# Patient Record
Sex: Male | Born: 1965 | Race: White | Hispanic: No | Marital: Single | Smoking: Never smoker
Health system: Southern US, Community
[De-identification: ages and names within clinical notes are randomized; demographics above are authoritative.]

## PROBLEM LIST (undated history)

## (undated) DIAGNOSIS — I10 Essential (primary) hypertension: Secondary | ICD-10-CM

## (undated) DIAGNOSIS — E119 Type 2 diabetes mellitus without complications: Secondary | ICD-10-CM

## (undated) HISTORY — PX: VASECTOMY: SHX75

## (undated) HISTORY — PX: CHOLECYSTECTOMY: SHX55

---

## 2019-12-21 ENCOUNTER — Encounter (INDEPENDENT_AMBULATORY_CARE_PROVIDER_SITE_OTHER): Payer: Self-pay

## 2020-01-17 ENCOUNTER — Encounter: Payer: Self-pay | Admitting: *Deleted

## 2020-01-17 ENCOUNTER — Other Ambulatory Visit: Payer: Self-pay

## 2020-01-17 ENCOUNTER — Emergency Department (HOSPITAL_COMMUNITY): Payer: 59

## 2020-01-17 ENCOUNTER — Inpatient Hospital Stay (HOSPITAL_COMMUNITY)
Admission: EM | Admit: 2020-01-17 | Discharge: 2020-01-26 | DRG: 872 | Disposition: A | Discharge status: Skilled Nursing Facility | Payer: 59 | Attending: Internal Medicine | Admitting: Internal Medicine

## 2020-01-17 ENCOUNTER — Encounter (HOSPITAL_COMMUNITY): Payer: Self-pay

## 2020-01-17 ENCOUNTER — Telehealth: Payer: Self-pay | Admitting: *Deleted

## 2020-01-17 DIAGNOSIS — Z23 Encounter for immunization: Secondary | ICD-10-CM | POA: Diagnosis not present

## 2020-01-17 DIAGNOSIS — I890 Lymphedema, not elsewhere classified: Secondary | ICD-10-CM | POA: Diagnosis present

## 2020-01-17 DIAGNOSIS — K7030 Alcoholic cirrhosis of liver without ascites: Secondary | ICD-10-CM | POA: Diagnosis not present

## 2020-01-17 DIAGNOSIS — N433 Hydrocele, unspecified: Secondary | ICD-10-CM | POA: Diagnosis present

## 2020-01-17 DIAGNOSIS — Z833 Family history of diabetes mellitus: Secondary | ICD-10-CM

## 2020-01-17 DIAGNOSIS — R17 Unspecified jaundice: Secondary | ICD-10-CM | POA: Diagnosis present

## 2020-01-17 DIAGNOSIS — Z8249 Family history of ischemic heart disease and other diseases of the circulatory system: Secondary | ICD-10-CM | POA: Diagnosis not present

## 2020-01-17 DIAGNOSIS — L03115 Cellulitis of right lower limb: Secondary | ICD-10-CM | POA: Diagnosis present

## 2020-01-17 DIAGNOSIS — D696 Thrombocytopenia, unspecified: Secondary | ICD-10-CM | POA: Diagnosis present

## 2020-01-17 DIAGNOSIS — I10 Essential (primary) hypertension: Secondary | ICD-10-CM | POA: Diagnosis present

## 2020-01-17 DIAGNOSIS — Z885 Allergy status to narcotic agent status: Secondary | ICD-10-CM

## 2020-01-17 DIAGNOSIS — N5089 Other specified disorders of the male genital organs: Secondary | ICD-10-CM | POA: Diagnosis not present

## 2020-01-17 DIAGNOSIS — D649 Anemia, unspecified: Secondary | ICD-10-CM | POA: Diagnosis present

## 2020-01-17 DIAGNOSIS — I358 Other nonrheumatic aortic valve disorders: Secondary | ICD-10-CM | POA: Diagnosis present

## 2020-01-17 DIAGNOSIS — A419 Sepsis, unspecified organism: Secondary | ICD-10-CM | POA: Diagnosis present

## 2020-01-17 DIAGNOSIS — N492 Inflammatory disorders of scrotum: Secondary | ICD-10-CM | POA: Diagnosis present

## 2020-01-17 DIAGNOSIS — E871 Hypo-osmolality and hyponatremia: Secondary | ICD-10-CM | POA: Diagnosis present

## 2020-01-17 DIAGNOSIS — R6520 Severe sepsis without septic shock: Secondary | ICD-10-CM | POA: Diagnosis not present

## 2020-01-17 DIAGNOSIS — E1165 Type 2 diabetes mellitus with hyperglycemia: Secondary | ICD-10-CM | POA: Diagnosis present

## 2020-01-17 DIAGNOSIS — E860 Dehydration: Secondary | ICD-10-CM | POA: Diagnosis present

## 2020-01-17 DIAGNOSIS — K7460 Unspecified cirrhosis of liver: Secondary | ICD-10-CM | POA: Diagnosis present

## 2020-01-17 DIAGNOSIS — R601 Generalized edema: Secondary | ICD-10-CM | POA: Diagnosis present

## 2020-01-17 DIAGNOSIS — E8809 Other disorders of plasma-protein metabolism, not elsewhere classified: Secondary | ICD-10-CM | POA: Diagnosis present

## 2020-01-17 DIAGNOSIS — D638 Anemia in other chronic diseases classified elsewhere: Secondary | ICD-10-CM | POA: Diagnosis present

## 2020-01-17 DIAGNOSIS — R609 Edema, unspecified: Secondary | ICD-10-CM

## 2020-01-17 DIAGNOSIS — Z590 Homelessness: Secondary | ICD-10-CM | POA: Diagnosis not present

## 2020-01-17 DIAGNOSIS — Z20822 Contact with and (suspected) exposure to covid-19: Secondary | ICD-10-CM | POA: Diagnosis present

## 2020-01-17 HISTORY — DX: Essential (primary) hypertension: I10

## 2020-01-17 HISTORY — DX: Type 2 diabetes mellitus without complications: E11.9

## 2020-01-17 LAB — APTT: aPTT: 37 seconds — ABNORMAL HIGH (ref 24–36)

## 2020-01-17 LAB — CBG MONITORING, ED
Glucose-Capillary: 360 mg/dL — ABNORMAL HIGH (ref 70–99)
Glucose-Capillary: 336 mg/dL — ABNORMAL HIGH (ref 70–99)
Glucose-Capillary: 282 mg/dL — ABNORMAL HIGH (ref 70–99)

## 2020-01-17 LAB — CBC WITH DIFFERENTIAL/PLATELET
Abs Immature Granulocytes: 0.05 10*3/uL (ref 0.00–0.07)
Basophils Absolute: 0 10*3/uL (ref 0.0–0.1)
Basophils Relative: 0 %
Eosinophils Absolute: 0 10*3/uL (ref 0.0–0.5)
Eosinophils Relative: 0 %
HCT: 33.3 % — ABNORMAL LOW (ref 39.0–52.0)
Hemoglobin: 11.1 g/dL — ABNORMAL LOW (ref 13.0–17.0)
Immature Granulocytes: 1 %
Lymphocytes Relative: 7 %
Lymphs Abs: 0.5 10*3/uL — ABNORMAL LOW (ref 0.7–4.0)
MCH: 30.2 pg (ref 26.0–34.0)
MCHC: 33.3 g/dL (ref 30.0–36.0)
MCV: 90.7 fL (ref 80.0–100.0)
Monocytes Absolute: 0.7 10*3/uL (ref 0.1–1.0)
Monocytes Relative: 9 %
Neutro Abs: 6.5 10*3/uL (ref 1.7–7.7)
Neutrophils Relative %: 83 %
Platelets: UNDETERMINED 10*3/uL (ref 150–400)
RBC: 3.67 MIL/uL — ABNORMAL LOW (ref 4.22–5.81)
RDW: 13.2 % (ref 11.5–15.5)
WBC: 7.7 10*3/uL (ref 4.0–10.5)
nRBC: 0 % (ref 0.0–0.2)

## 2020-01-17 LAB — COMPREHENSIVE METABOLIC PANEL
ALT: 14 U/L (ref 0–44)
AST: 24 U/L (ref 15–41)
Albumin: 2 g/dL — ABNORMAL LOW (ref 3.5–5.0)
Alkaline Phosphatase: 99 U/L (ref 38–126)
Anion gap: 14 (ref 5–15)
BUN: 20 mg/dL (ref 6–20)
CO2: 16 mmol/L — ABNORMAL LOW (ref 22–32)
Calcium: 7.4 mg/dL — ABNORMAL LOW (ref 8.9–10.3)
Chloride: 93 mmol/L — ABNORMAL LOW (ref 98–111)
Creatinine, Ser: 1.04 mg/dL (ref 0.61–1.24)
GFR calc Af Amer: >60 mL/min (ref >60)
GFR calc non Af Amer: >60 mL/min (ref >60)
Glucose, Bld: 377 mg/dL — ABNORMAL HIGH (ref 70–99)
Potassium: 4.2 mmol/L (ref 3.5–5.1)
Sodium: 123 mmol/L — ABNORMAL LOW (ref 135–145)
Total Bilirubin: 3.4 mg/dL — ABNORMAL HIGH (ref 0.3–1.2)
Total Protein: 5.6 g/dL — ABNORMAL LOW (ref 6.5–8.1)

## 2020-01-17 LAB — URINALYSIS, ROUTINE W REFLEX MICROSCOPIC
Bilirubin Urine: NEGATIVE
Glucose, UA: >=500 mg/dL — AB
Ketones, ur: 5 mg/dL — AB
Leukocytes,Ua: NEGATIVE
Nitrite: NEGATIVE
Protein, ur: 30 mg/dL — AB
Specific Gravity, Urine: 1.031 — ABNORMAL HIGH (ref 1.005–1.030)
pH: 5 (ref 5.0–8.0)

## 2020-01-17 LAB — LACTIC ACID, PLASMA
Lactic Acid, Venous: 3.6 mmol/L (ref 0.5–1.9)
Lactic Acid, Venous: 2.5 mmol/L (ref 0.5–1.9)

## 2020-01-17 LAB — CBC
HCT: 32.8 % — ABNORMAL LOW (ref 39.0–52.0)
Hemoglobin: 11 g/dL — ABNORMAL LOW (ref 13.0–17.0)
MCH: 30.6 pg (ref 26.0–34.0)
MCHC: 33.5 g/dL (ref 30.0–36.0)
MCV: 91.1 fL (ref 80.0–100.0)
Platelets: UNDETERMINED 10*3/uL (ref 150–400)
RBC: 3.6 MIL/uL — ABNORMAL LOW (ref 4.22–5.81)
RDW: 13 % (ref 11.5–15.5)
WBC: 6.5 10*3/uL (ref 4.0–10.5)
nRBC: 0 % (ref 0.0–0.2)

## 2020-01-17 LAB — HEMOGLOBIN A1C
Hgb A1c MFr Bld: 8.8 % — ABNORMAL HIGH (ref 4.8–5.6)
Mean Plasma Glucose: 205.86 mg/dL

## 2020-01-17 LAB — PROTIME-INR
INR: 1.5 — ABNORMAL HIGH (ref 0.8–1.2)
Prothrombin Time: 17.7 seconds — ABNORMAL HIGH (ref 11.4–15.2)

## 2020-01-17 LAB — HIV ANTIBODY (ROUTINE TESTING W REFLEX): HIV Screen 4th Generation wRfx: NONREACTIVE

## 2020-01-17 LAB — CREATININE, SERUM
Creatinine, Ser: 1.02 mg/dL (ref 0.61–1.24)
GFR calc Af Amer: >60 mL/min (ref >60)
GFR calc non Af Amer: >60 mL/min (ref >60)

## 2020-01-17 LAB — SARS CORONAVIRUS 2 BY RT PCR (HOSPITAL ORDER, PERFORMED IN ~~LOC~~ HOSPITAL LAB): SARS Coronavirus 2: NEGATIVE

## 2020-01-17 LAB — PROCALCITONIN: Procalcitonin: 0.77 ng/mL

## 2020-01-17 MED ORDER — VANCOMYCIN HCL 2000 MG/400ML IV SOLN
2000.0000 mg | Freq: Once | INTRAVENOUS | Status: AC
  Administered 2020-01-17: 2000 mg via INTRAVENOUS
  Filled 2020-01-17: qty 400

## 2020-01-17 MED ORDER — LACTATED RINGERS IV SOLN: INTRAVENOUS | Status: AC

## 2020-01-17 MED ORDER — HEPARIN SODIUM (PORCINE) 5000 UNIT/ML IJ SOLN
5000.0000 [IU] | Freq: Three times a day (TID) | INTRAMUSCULAR | Status: DC
  Administered 2020-01-17 – 2020-01-18 (×3): 5000 [IU] via SUBCUTANEOUS
  Filled 2020-01-17 (×3): qty 1

## 2020-01-17 MED ORDER — METRONIDAZOLE IN NACL 5-0.79 MG/ML-% IV SOLN
500.0000 mg | Freq: Once | INTRAVENOUS | Status: AC
  Administered 2020-01-17: 500 mg via INTRAVENOUS
  Filled 2020-01-17: qty 100

## 2020-01-17 MED ORDER — LACTATED RINGERS IV BOLUS (SEPSIS)
1000.0000 mL | Freq: Once | INTRAVENOUS | Status: AC
  Administered 2020-01-17: 1000 mL via INTRAVENOUS

## 2020-01-17 MED ORDER — ONDANSETRON HCL 4 MG PO TABS: 4.0000 mg | ORAL_TABLET | Freq: Four times a day (QID) | ORAL | Status: DC | PRN

## 2020-01-17 MED ORDER — VANCOMYCIN HCL IN DEXTROSE 1-5 GM/200ML-% IV SOLN: 1000.0000 mg | Freq: Once | INTRAVENOUS | Status: DC

## 2020-01-17 MED ORDER — VANCOMYCIN HCL 1250 MG/250ML IV SOLN
1250.0000 mg | Freq: Three times a day (TID) | INTRAVENOUS | Status: DC
  Administered 2020-01-18 (×2): 1250 mg via INTRAVENOUS
  Filled 2020-01-17 (×2): qty 250

## 2020-01-17 MED ORDER — ONDANSETRON HCL 4 MG/2ML IJ SOLN
4.0000 mg | Freq: Four times a day (QID) | INTRAMUSCULAR | Status: DC | PRN
  Administered 2020-01-19: 4 mg via INTRAVENOUS
  Filled 2020-01-17: qty 2

## 2020-01-17 MED ORDER — INSULIN ASPART 100 UNIT/ML ~~LOC~~ SOLN
0.0000 [IU] | Freq: Three times a day (TID) | SUBCUTANEOUS | Status: DC
  Administered 2020-01-18: 3 [IU] via SUBCUTANEOUS

## 2020-01-17 MED ORDER — SODIUM CHLORIDE 0.9 % IV SOLN
1.0000 g | Freq: Three times a day (TID) | INTRAVENOUS | Status: DC
  Administered 2020-01-18 (×2): 1 g via INTRAVENOUS
  Filled 2020-01-17 (×3): qty 1

## 2020-01-17 MED ORDER — INSULIN ASPART 100 UNIT/ML ~~LOC~~ SOLN
5.0000 [IU] | Freq: Three times a day (TID) | SUBCUTANEOUS | Status: DC
  Administered 2020-01-18 – 2020-01-26 (×26): 5 [IU] via SUBCUTANEOUS

## 2020-01-17 MED ORDER — ACETAMINOPHEN 650 MG RE SUPP: 650.0000 mg | Freq: Four times a day (QID) | RECTAL | Status: DC | PRN

## 2020-01-17 MED ORDER — HYDRALAZINE HCL 20 MG/ML IJ SOLN: 10.0000 mg | INTRAMUSCULAR | Status: DC | PRN

## 2020-01-17 MED ORDER — IOHEXOL 300 MG/ML  SOLN
100.0000 mL | Freq: Once | INTRAMUSCULAR | Status: AC | PRN
  Administered 2020-01-17: 100 mL via INTRAVENOUS

## 2020-01-17 MED ORDER — INSULIN GLARGINE 100 UNIT/ML ~~LOC~~ SOLN
25.0000 [IU] | Freq: Every day | SUBCUTANEOUS | Status: DC
  Administered 2020-01-17 – 2020-01-25 (×9): 25 [IU] via SUBCUTANEOUS
  Filled 2020-01-17 (×11): qty 0.25

## 2020-01-17 MED ORDER — ACETAMINOPHEN 325 MG PO TABS
650.0000 mg | ORAL_TABLET | Freq: Four times a day (QID) | ORAL | Status: DC | PRN
  Administered 2020-01-18 – 2020-01-25 (×10): 650 mg via ORAL
  Filled 2020-01-17 (×10): qty 2

## 2020-01-17 MED ORDER — SODIUM CHLORIDE 0.9 % IV SOLN
2.0000 g | Freq: Once | INTRAVENOUS | Status: AC
  Administered 2020-01-17: 2 g via INTRAVENOUS
  Filled 2020-01-17: qty 2

## 2020-01-17 NOTE — Sepsis Progress Note (Signed)
Notified provider of need to draw repeat lactic acid. Lactic acid ordered.

## 2020-01-17 NOTE — ED Notes (Signed)
RN assumed care for pt, pt denies any needs at this time, pt a/ox4 VSS, pt requesting food, RN will review orders to see if pt can eat.

## 2020-01-17 NOTE — Sepsis Progress Note (Signed)
Notified bedside nurse of need to draw repeat lactic acid. 

## 2020-01-17 NOTE — ED Notes (Signed)
Date and time results received: 01/17/20 1617 (use smartphrase ".now" to insert current time)  Test: lactic Critical Value: 3.6  Name of Provider Notified: allen  Orders Received? Or Actions Taken?:orderd are in

## 2020-01-17 NOTE — ED Provider Notes (Signed)
[facility]   CSN: [id number] Arrival date & time: 01/17/20  1404     History Chief Complaint  Patient presents with  . multiple complaints    Swiollen legs, pain in calf musles, diarhea, difficulty breathing , high bp, high blood sugar, pt has not taken meds (metformin)    Dennis CohoJames Green is a 54 y.o. male with history of diabetes, hypertension presents for evaluation of acute onset, progressively worsening right lower extremity swelling and skin color changes for 2 weeks, nausea vomiting diarrhea and fevers for 3 days.   Reports that 2 weeks ago he noted an area of erythema to the posterior aspect of the right thigh.  Reports the area has remained similar in size.  No known injuries or insect bites.  The extremity has become progressively more swollen which is atypical for him per his report.  Denies numbness or tingling to the area.  On Friday developed nausea, vomiting, and diarrhea.  Emesis is nonbloody nonbilious and diarrhea is watery but nonbloody.  Has had subjective fevers and chills.  [facility] where he was noted to be febrile to 103.1 F, hyperglycemic with a sugar of 413, and hypertensive with a blood pressure of 183/78.  Reports he has not been able to keep down any of his medications for the last few days.  He ambulates with the aid of a cane at baseline due to a prior back injury years ago.  He is a non-smoker, denies recreational drug use or excessive alcohol intake.  Denies recent travel or surgeries, hemoptysis, prior history of DVT or PE, or suspicious food intake.  He is fully vaccinated against Covid.  The history is provided by the patient and medical records.       Past Medical History:  Diagnosis Date  . Diabetes mellitus without complication (HCC)   . Hypertension     Patient Active Problem List   Diagnosis Date Noted  . Sepsis (HCC) 01/17/2020        Family History  Problem Relation  Age of Onset  . Hypertension Mother   . Hypertension Father   . Diabetes Father   . Cancer Sister     Social History   Tobacco Use  . Smoking status: Never Smoker  . Smokeless tobacco: Never Used  Vaping Use  . Vaping Use: Never used  Substance Use Topics  . Alcohol use: Yes    Comment: 2 drinks a year   . Drug use: Never    Home Medications Prior to Admission medications   Not on File    Allergies    Codeine  Review of Systems   Review of Systems  Constitutional: Positive for chills and fever.  Respiratory: Negative for shortness of breath.   Cardiovascular: Positive for leg swelling. Negative for chest pain.  Gastrointestinal: Positive for diarrhea, nausea and vomiting.  Skin: Positive for color change.  Neurological: Negative for numbness.  All other systems reviewed and are negative.   Physical Exam Updated Vital Signs BP (!) 117/57   Pulse 77   Temp (!) 100.7 F (38.2 C) (Oral)   Resp (!) 36   Ht 5\' 9"  (1.753 m)   Wt 132 kg   SpO2 100%   BMI 42.97 kg/m   Physical Exam Vitals and nursing note reviewed.  Constitutional:      General: He is not in acute distress.    Appearance: He is well-developed.  HENT:  Head: Normocephalic and atraumatic.  Eyes:     General:        Right eye: No discharge.        Left eye: No discharge.     Conjunctiva/sclera: Conjunctivae normal.  Neck:     Vascular: No JVD.     Trachea: No tracheal deviation.  Cardiovascular:     Rate and Rhythm: Normal rate and regular rhythm.     Pulses: Normal pulses.     Comments: 2+ radial and DP/PT pulses bilaterally.  Denna Haggard' sign absent bilaterally.  2+ pitting edema of the right lower extremity Pulmonary:     Effort: Pulmonary effort is normal.  Abdominal:     General: There is no distension.     Palpations: Abdomen is soft.     Tenderness: There is no abdominal tenderness. There is no guarding or rebound.  Musculoskeletal:     Cervical back: Neck supple.     Comments:  5/5 strength of BLE major muscle groups  Skin:    General: Skin is warm.     Findings: Erythema and rash present.     Comments: See below images.  Patient with significant erythema and induration to the posterior right thigh extending to the calf although the posterior aspect of the rash appears more petechial.  It does not blanch.  No underlying fluctuance noted.  Neurological:     Mental Status: He is alert.     Comments: Sensation intact to light touch of bilateral lower extremities  Psychiatric:        Behavior: Behavior normal.         ED Results / Procedures / Treatments   Labs (all labs ordered are listed, but only abnormal results are displayed) Labs Reviewed  LACTIC ACID, PLASMA - Abnormal; Notable for the following components:      Result Value   Lactic Acid, Venous 3.6 (*)    All other components within normal limits  COMPREHENSIVE METABOLIC PANEL - Abnormal; Notable for the following components:   Sodium 123 (*)    Chloride 93 (*)    CO2 16 (*)    Glucose, Bld 377 (*)    Calcium 7.4 (*)    Total Protein 5.6 (*)    Albumin 2.0 (*)    Total Bilirubin 3.4 (*)    All other components within normal limits  CBC WITH DIFFERENTIAL/PLATELET - Abnormal; Notable for the following components:   RBC 3.67 (*)    Hemoglobin 11.1 (*)    HCT 33.3 (*)    Lymphs Abs 0.5 (*)    All other components within normal limits  PROTIME-INR - Abnormal; Notable for the following components:   Prothrombin Time 17.7 (*)    INR 1.5 (*)    All other components within normal limits  APTT - Abnormal; Notable for the following components:   aPTT 37 (*)    All other components within normal limits  URINALYSIS, ROUTINE W REFLEX MICROSCOPIC - Abnormal; Notable for the following components:   Color, Urine AMBER (*)    APPearance HAZY (*)    Specific Gravity, Urine 1.031 (*)    Glucose, UA >=500 (*)    Hgb urine dipstick LARGE (*)    Ketones, ur 5 (*)    Protein, ur 30 (*)    Bacteria, UA  RARE (*)    All other components within normal limits  CBG MONITORING, ED - Abnormal; Notable for the following components:   Glucose-Capillary 360 (*)    All  other components within normal limits  CBG MONITORING, ED - Abnormal; Notable for the following components:   Glucose-Capillary 336 (*)    All other components within normal limits  SARS CORONAVIRUS 2 BY RT PCR (HOSPITAL ORDER, PERFORMED IN [facility])  CULTURE, BLOOD (ROUTINE X 2)  CULTURE, BLOOD (ROUTINE X 2)  URINE CULTURE  LACTIC ACID, PLASMA    EKG EKG Interpretation  Date/Time:  Monday January 17 2020 15:14:48 EDT Ventricular Rate:  84 PR Interval:    QRS Duration: 98 QT Interval:  387 QTC Calculation: 458 R Axis:   73 Text Interpretation: Sinus rhythm Probable left atrial enlargement Borderline T wave abnormalities No previous ECGs available Confirmed by Richardean Canal [phone]) on 01/17/2020 7:50:01 PM   Radiology CT FEMUR RIGHT W CONTRAST  Result Date: 01/17/2020 CLINICAL DATA:  Pain and swelling in the right leg for several weeks EXAM: CT OF THE LOWER RIGHT EXTREMITY WITH CONTRAST TECHNIQUE: Multidetector CT imaging of the lower right extremity was performed according to the standard protocol following intravenous contrast administration. COMPARISON:  None. CONTRAST:  OMNIPAQUE IOHEXOL 300 MG/ML  SOLN FINDINGS: Bones/Joint/Cartilage No fracture or dislocation. Mild right hip osteoarthritis is seen with joint space loss and marginal osteophyte formation. Medial compartment osteoarthritis seen at the knee with joint space loss. No areas of cortical destruction or periosteal reaction are noted. No large knee, ankle, or hip joint effusion is seen. Ligaments Suboptimally assessed by CT. Muscles and Tendons Mild fatty atrophy seen within the muscles surrounding the femur and lower extremity. No focal atrophy or tear however is seen. The visualized portion the tendons intact. Soft tissues There is diffuse  subcutaneous edema and skin thickening seen the anterolateral of the femur and extremity. There tortuous superficial varicosities noted. Edema is seen also within the popliteal fossa. No loculated fluid collection or subcutaneous emphysema is seen. IMPRESSION: Diffuse subcutaneous edema and skin thickening seen predominantly along the anterolateral aspect of the lower extremity which is likely due to diffuse cellulitis. No loculated fluid collections or evidence of osteomyelitis. Edema within the popliteal fossa. Electronically Signed   By: Jonna Clark M.D.   On: 01/17/2020 18:28   CT TIBIA FIBULA RIGHT W CONTRAST  Result Date: 01/17/2020 CLINICAL DATA:  Pain and swelling in the right leg for several weeks EXAM: CT OF THE LOWER RIGHT EXTREMITY WITH CONTRAST TECHNIQUE: Multidetector CT imaging of the lower right extremity was performed according to the standard protocol following intravenous contrast administration. COMPARISON:  None. CONTRAST:  OMNIPAQUE IOHEXOL 300 MG/ML  SOLN FINDINGS: Bones/Joint/Cartilage No fracture or dislocation. Mild right hip osteoarthritis is seen with joint space loss and marginal osteophyte formation. Medial compartment osteoarthritis seen at the knee with joint space loss. No areas of cortical destruction or periosteal reaction are noted. No large knee, ankle, or hip joint effusion is seen. Ligaments Suboptimally assessed by CT. Muscles and Tendons Mild fatty atrophy seen within the muscles surrounding the femur and lower extremity. No focal atrophy or tear however is seen. The visualized portion the tendons intact. Soft tissues There is diffuse subcutaneous edema and skin thickening seen the anterolateral of the femur and extremity. There tortuous superficial varicosities noted. Edema is seen also within the popliteal fossa. No loculated fluid collection or subcutaneous emphysema is seen. IMPRESSION: Diffuse subcutaneous edema and skin thickening seen predominantly along the  anterolateral aspect of the lower extremity which is likely due to diffuse cellulitis. No loculated fluid collections or evidence of osteomyelitis. Edema within  the popliteal fossa. Electronically Signed   By: Jonna Clark M.D.   On: 01/17/2020 18:28   DG Chest Port 1 View  Result Date: 01/17/2020 CLINICAL DATA:  Questionable sepsis EXAM: PORTABLE CHEST 1 VIEW COMPARISON:  None. FINDINGS: The heart size and mediastinal contours are within normal limits. There are mild to moderate diffuse bilateral interstitial opacities. No pleural effusion or pneumothorax. The visualized skeletal structures are unremarkable. IMPRESSION: Mild to moderate diffuse bilateral interstitial opacities, which may reflect pulmonary edema or atypical infection. Electronically Signed   By: Romona Curls M.D.   On: 01/17/2020 15:14   VAS [place] LOWER EXTREMITY VENOUS (DVT) (ONLY [facility] & [facility] 7a-7p)  Result Date: 01/17/2020  Lower Venous DVTStudy Indications: Edema.  Limitations: Body habitus and poor ultrasound/tissue interface. Comparison Study: no prior Performing Technologist: Blanch Media RVS  Examination Guidelines: A complete evaluation includes B-mode imaging, spectral Doppler, color Doppler, and power Doppler as needed of all accessible portions of each vessel. Bilateral testing is considered an integral part of a complete examination. Limited examinations for reoccurring indications may be performed as noted. The reflux portion of the exam is performed with the patient in reverse Trendelenburg.  +---------+---------------+---------+-----------+----------+--------------+ RIGHT    CompressibilityPhasicitySpontaneityPropertiesThrombus Aging +---------+---------------+---------+-----------+----------+--------------+ CFV      Full           Yes      Yes                                 +---------+---------------+---------+-----------+----------+--------------+ SFJ      Full                                                         +---------+---------------+---------+-----------+----------+--------------+ FV Prox  Full                                                        +---------+---------------+---------+-----------+----------+--------------+ FV Mid                  Yes      Yes                                 +---------+---------------+---------+-----------+----------+--------------+ FV Distal               Yes      Yes                                 +---------+---------------+---------+-----------+----------+--------------+ PFV      Full                                                        +---------+---------------+---------+-----------+----------+--------------+ POP      Full           Yes      Yes                                 +---------+---------------+---------+-----------+----------+--------------+  PTV      Full                                                        +---------+---------------+---------+-----------+----------+--------------+ PERO                                                  Not visualized +---------+---------------+---------+-----------+----------+--------------+   +----+---------------+---------+-----------+----------+--------------+ LEFTCompressibilityPhasicitySpontaneityPropertiesThrombus Aging +----+---------------+---------+-----------+----------+--------------+ CFV Full           Yes      Yes                                 +----+---------------+---------+-----------+----------+--------------+     Summary: RIGHT: - There is no evidence of deep vein thrombosis in the lower extremity. However, portions of this examination were limited- see technologist comments above.  - No cystic structure found in the popliteal fossa.  LEFT: - No evidence of common femoral vein obstruction.  *See table(s) above for measurements and observations.    Preliminary     Procedures .Critical Care Performed by: Jeanie Sewer, PA-C Authorized by:  Jeanie Sewer, PA-C   Critical care provider statement:    Critical care time (minutes):  45   Critical care was necessary to treat or prevent imminent or life-threatening deterioration of the following conditions:  Sepsis   Critical care was time spent personally by me on the following activities:  Discussions with consultants, evaluation of patient's response to treatment, examination of patient, ordering and performing treatments and interventions, ordering and review of laboratory studies, ordering and review of radiographic studies, pulse oximetry, re-evaluation of patient's condition, obtaining history from patient or surrogate and review of old charts   (including critical care time)  Medications Ordered in ED Medications  lactated ringers infusion ( Intravenous New Bag/Given 01/17/20 1623)  ceFEPIme (MAXIPIME) 1 g in sodium chloride 0.9 % 100 mL IVPB (has no administration in time range)  vancomycin (VANCOREADY) IVPB 1250 mg/250 mL (has no administration in time range)  lactated ringers bolus 1,000 mL (0 mLs Intravenous Stopped 01/17/20 1535)    And  lactated ringers bolus 1,000 mL (0 mLs Intravenous Stopped 01/17/20 1536)    And  lactated ringers bolus 1,000 mL (0 mLs Intravenous Stopped 01/17/20 1536)    And  lactated ringers bolus 1,000 mL (0 mLs Intravenous Stopped 01/17/20 1719)  ceFEPIme (MAXIPIME) 2 g in sodium chloride 0.9 % 100 mL IVPB (0 g Intravenous Stopped 01/17/20 1513)  metroNIDAZOLE (FLAGYL) IVPB 500 mg (0 mg Intravenous Stopped 01/17/20 1623)  vancomycin (VANCOREADY) IVPB 2000 mg/400 mL (0 mg Intravenous Stopped 01/17/20 1724)  iohexol (OMNIPAQUE) 300 MG/ML solution 100 mL (100 mLs Intravenous Contrast Given 01/17/20 1804)    ED Course  I have reviewed the triage vital signs and the nursing notes.  Pertinent labs & imaging results that were available during my care of the patient were reviewed by me and considered in my medical decision making (see chart for  details).    MDM Rules/Calculators/A&P  Patient presenting from [facility] for evaluation of fever, right lower extremity pain swelling, skin color changes for 2 weeks, nausea vomiting diarrhea for 3 days.  Presents to the ED febrile and tachypneic.  Examination of the right lower extremity concerning for cellulitis versus DVT.  Low suspicion of necrotizing fasciitis given the duration of symptoms.  Will obtain DVT study and CT scan to rule out underlying fluid collection.  Patient was started on 30 cc/kg fluid bolus and given broad-spectrum antibiotics in the ED.  Lab work reviewed and interpreted by myself shows no leukocytosis, mild anemia with hemoglobin of 11.1, no renal insufficiency.  He is hyperglycemic but does not appear to be in DKA with normal anion gap.  He is hyponatremic though this is partially dilutional in the setting of his hyperglycemia.  Chest x-ray shows mild to moderate diffuse bilateral interstitial opacities which could reflect edema or atypical infection.  His Covid test is negative.  His DVT study is negative though portions of the examination were limited.  CT scan confirms presence of cellulitis with no evidence of drainable fluid collection.  Spoke with Dr. Toniann Fail with [facility] hospitalist service who agrees to assume care of patient and bring him into the hospital for further evaluation and management.  Final Clinical Impression(s) / ED Diagnoses Final diagnoses:  Sepsis without acute organ dysfunction, due to unspecified organism [facility] 2)    Rx / DC Orders ED Discharge Orders    None       Bennye Alm 01/17/20 2035    Lorre Nick, MD 01/19/20 [phone]

## 2020-01-17 NOTE — H&P (Addendum)
History and Physical    Dennis CohoJames Green WUJ:[id number] DOB: [birth year] DOA: 01/17/2020  PCP: Patient, No Pcp Per   Patient coming from: Home.  Chief Complaint: Right leg pain nausea vomiting.  HPI: Dennis CohoJames Green is a 54 y.o. male with history of hypertension and diabetes mellitus type 2 has been having increasing pain in his right lower extremity over the last 2 weeks.  Last couple of days patient also had some nausea vomiting without any abdominal pain or diarrhea.  Denies chest pain or shortness of breath or productive cough.  Has been having subjective feeling of fever and chills.  Patient [facility] where patient was referred to the ER after patient was found to be febrile hypotensive and elevated blood sugar.  Patient states he has not taken his medications for last 3 or 4 days because he was having nausea and was not feeling well.  Patient states his swelling in the right lower extremity started off around the knee area which is gradually worsening both proximally and distally.  Denies any trauma or insect bites.  Denies eating any shellfish.  ED Course: In the ER patient was febrile with temperature 103 F lactic acid of 3.6 CT scan of the right leg shows features concerning for cellulitis.  On exam patient has good sensation of the leg has no significant restriction of movement and no definite signs of compartment syndrome.  Patient had blood cultures drawn and started on empiric antibiotics after sepsis protocol was started.  Patient was given empiric antibiotics and fluid bolus as per sepsis protocol.  Blood sugar was elevated at around 377.  Sodium was 123.  Hemoglobin 1.3 platelets claims no definite number was able to be counted.  Dopplers were negative for DVT.  Review of Systems: As per HPI, rest all negative.   Past Medical History:  Diagnosis Date  . Diabetes mellitus without complication (HCC)   . Hypertension     Past Surgical History:  Procedure Laterality Date   . CHOLECYSTECTOMY    . VASECTOMY       reports that he has never smoked. He has never used smokeless tobacco. He reports current alcohol use. He reports that he does not use drugs.  Allergies  Allergen Reactions  . Codeine Hives    Family History  Problem Relation Age of Onset  . Hypertension Mother   . Hypertension Father   . Diabetes Father   . Cancer Sister     Prior to Admission medications   Not on File    Physical Exam: Constitutional: Moderately built and nourished. Vitals:   01/17/20 1900 01/17/20 1945 01/17/20 2000 01/17/20 2015  BP: 136/60 124/61 (!) 117/57   Pulse: 78 81 77   Resp: (!) 23 (!) 30 (!) 36   Temp:    (!) 100.7 F (38.2 C)  TempSrc:    Oral  SpO2: 97% 100% 100%   Weight:      Height:       Eyes: Anicteric no pallor. ENMT: No discharge from the ears eyes nose or mouth. Neck: No mass felt.  No neck rigidity. Respiratory: No rhonchi or crepitations. Cardiovascular: S1-S2 heard. Abdomen: Soft nontender bowel sounds present. Musculoskeletal: Swelling of the entire right lower extremity has good sensation.  Able to flex his knee and ankle. Skin: Mild erythema of the right lower extremity extending from the thighs down to the foot. Neurologic: Alert awake oriented to time place and person.  Moves all extremities. Psychiatric: Appears  normal.  Normal affect.   Labs on Admission: I have personally reviewed following labs and imaging studies  CBC: Recent Labs  Lab 01/17/20 1515  WBC 7.7  NEUTROABS 6.5  HGB 11.1*  HCT 33.3*  MCV 90.7  PLT PLATELET CLUMPS NOTED ON SMEAR, UNABLE TO ESTIMATE   Basic Metabolic Panel: Recent Labs  Lab 01/17/20 1515  NA 123*  K 4.2  CL 93*  CO2 16*  GLUCOSE 377*  BUN 20  CREATININE 1.04  CALCIUM 7.4*   GFR: Estimated Creatinine Clearance: 106.8 mL/min (by C-G formula based on SCr of 1.04 mg/dL). Liver Function Tests: Recent Labs  Lab 01/17/20 1515  AST 24  ALT 14  ALKPHOS 99  BILITOT 3.4*   PROT 5.6*  ALBUMIN 2.0*   No results for input(s): LIPASE, AMYLASE in the last 168 hours. No results for input(s): AMMONIA in the last 168 hours. Coagulation Profile: Recent Labs  Lab 01/17/20 1515  INR 1.5*   Cardiac Enzymes: No results for input(s): CKTOTAL, CKMB, CKMBINDEX, TROPONINI in the last 168 hours. BNP (last 3 results) No results for input(s): PROBNP in the last 8760 hours. HbA1C: No results for input(s): HGBA1C in the last 72 hours. CBG: Recent Labs  Lab 01/17/20 1412 01/17/20 1607  GLUCAP 360* 336*   Lipid Profile: No results for input(s): CHOL, HDL, LDLCALC, TRIG, CHOLHDL, LDLDIRECT in the last 72 hours. Thyroid Function Tests: No results for input(s): TSH, T4TOTAL, FREET4, T3FREE, THYROIDAB in the last 72 hours. Anemia Panel: No results for input(s): VITAMINB12, FOLATE, FERRITIN, TIBC, IRON, RETICCTPCT in the last 72 hours. Urine analysis:    Component Value Date/Time   COLORURINE AMBER (A) 01/17/2020 1442   APPEARANCEUR HAZY (A) 01/17/2020 1442   LABSPEC 1.031 (H) 01/17/2020 1442   PHURINE 5.0 01/17/2020 1442   GLUCOSEU >=500 (A) 01/17/2020 1442   HGBUR LARGE (A) 01/17/2020 1442   BILIRUBINUR NEGATIVE 01/17/2020 1442   KETONESUR 5 (A) 01/17/2020 1442   PROTEINUR 30 (A) 01/17/2020 1442   NITRITE NEGATIVE 01/17/2020 1442   LEUKOCYTESUR NEGATIVE 01/17/2020 1442   Sepsis Labs: (procalcitonin:4,lacticidven:4) ) Recent Results (from the past 240 hour(s))  SARS Coronavirus 2 by RT PCR (hospital order, performed in [facility] hospital lab) Nasopharyngeal Nasopharyngeal Swab     Status: None   Collection Time: 01/17/20  3:17 PM   Specimen: Nasopharyngeal Swab  Result Value Ref Range Status   SARS Coronavirus 2 NEGATIVE NEGATIVE Final    Comment: (NOTE) SARS-CoV-2 target nucleic acids are NOT DETECTED.  The SARS-CoV-2 RNA is generally detectable in upper and lower respiratory specimens during the acute phase of infection. The  lowest concentration of SARS-CoV-2 viral copies this assay can detect is 250 copies / mL. A negative result does not preclude SARS-CoV-2 infection and should not be used as the sole basis for treatment or other patient management decisions.  A negative result may occur with improper specimen collection / handling, submission of specimen other than nasopharyngeal swab, presence of viral mutation(s) within the areas targeted by this assay, and inadequate number of viral copies (<250 copies / mL). A negative result must be combined with clinical observations, patient history, and epidemiological information.  Fact Sheet for Patients:   BoilerBrush.com.cy  Fact Sheet for Healthcare Providers: https://pope.com/  This test is not yet approved or  cleared by the [place] FDA and has been authorized for detection and/or diagnosis of SARS-CoV-2 by FDA under an Emergency Use Authorization (EUA).  This EUA will remain in effect (meaning  this test can be used) for the duration of the COVID-19 declaration under Section 564(b)(1) of the Act, 21 U.S.C. section 360bbb-3(b)(1), unless the authorization is terminated or revoked sooner.  Performed at [facility], [address], [place], [place] [address]      Radiological Exams on Admission: CT FEMUR RIGHT W CONTRAST  Result Date: 01/17/2020 CLINICAL DATA:  Pain and swelling in the right leg for several weeks EXAM: CT OF THE LOWER RIGHT EXTREMITY WITH CONTRAST TECHNIQUE: Multidetector CT imaging of the lower right extremity was performed according to the standard protocol following intravenous contrast administration. COMPARISON:  None. CONTRAST:  OMNIPAQUE IOHEXOL 300 MG/ML  SOLN FINDINGS: Bones/Joint/Cartilage No fracture or dislocation. Mild right hip osteoarthritis is seen with joint space loss and marginal osteophyte formation. Medial compartment osteoarthritis seen at the knee with  joint space loss. No areas of cortical destruction or periosteal reaction are noted. No large knee, ankle, or hip joint effusion is seen. Ligaments Suboptimally assessed by CT. Muscles and Tendons Mild fatty atrophy seen within the muscles surrounding the femur and lower extremity. No focal atrophy or tear however is seen. The visualized portion the tendons intact. Soft tissues There is diffuse subcutaneous edema and skin thickening seen the anterolateral of the femur and extremity. There tortuous superficial varicosities noted. Edema is seen also within the popliteal fossa. No loculated fluid collection or subcutaneous emphysema is seen. IMPRESSION: Diffuse subcutaneous edema and skin thickening seen predominantly along the anterolateral aspect of the lower extremity which is likely due to diffuse cellulitis. No loculated fluid collections or evidence of osteomyelitis. Edema within the popliteal fossa. Electronically Signed   By: Jonna Clark M.D.   On: 01/17/2020 18:28   CT TIBIA FIBULA RIGHT W CONTRAST  Result Date: 01/17/2020 CLINICAL DATA:  Pain and swelling in the right leg for several weeks EXAM: CT OF THE LOWER RIGHT EXTREMITY WITH CONTRAST TECHNIQUE: Multidetector CT imaging of the lower right extremity was performed according to the standard protocol following intravenous contrast administration. COMPARISON:  None. CONTRAST:  OMNIPAQUE IOHEXOL 300 MG/ML  SOLN FINDINGS: Bones/Joint/Cartilage No fracture or dislocation. Mild right hip osteoarthritis is seen with joint space loss and marginal osteophyte formation. Medial compartment osteoarthritis seen at the knee with joint space loss. No areas of cortical destruction or periosteal reaction are noted. No large knee, ankle, or hip joint effusion is seen. Ligaments Suboptimally assessed by CT. Muscles and Tendons Mild fatty atrophy seen within the muscles surrounding the femur and lower extremity. No focal atrophy or tear however is seen. The  visualized portion the tendons intact. Soft tissues There is diffuse subcutaneous edema and skin thickening seen the anterolateral of the femur and extremity. There tortuous superficial varicosities noted. Edema is seen also within the popliteal fossa. No loculated fluid collection or subcutaneous emphysema is seen. IMPRESSION: Diffuse subcutaneous edema and skin thickening seen predominantly along the anterolateral aspect of the lower extremity which is likely due to diffuse cellulitis. No loculated fluid collections or evidence of osteomyelitis. Edema within the popliteal fossa. Electronically Signed   By: Jonna Clark M.D.   On: 01/17/2020 18:28   DG Chest Port 1 View  Result Date: 01/17/2020 CLINICAL DATA:  Questionable sepsis EXAM: PORTABLE CHEST 1 VIEW COMPARISON:  None. FINDINGS: The heart size and mediastinal contours are within normal limits. There are mild to moderate diffuse bilateral interstitial opacities. No pleural effusion or pneumothorax. The visualized skeletal structures are unremarkable. IMPRESSION: Mild to moderate diffuse bilateral interstitial  opacities, which may reflect pulmonary edema or atypical infection. Electronically Signed   By: Romona Curls M.D.   On: 01/17/2020 15:14   VAS [place] LOWER EXTREMITY VENOUS (DVT) (ONLY [facility] & [facility] 7a-7p)  Result Date: 01/17/2020  Lower Venous DVTStudy Indications: Edema.  Limitations: Body habitus and poor ultrasound/tissue interface. Comparison Study: no prior Performing Technologist: Blanch Media RVS  Examination Guidelines: A complete evaluation includes B-mode imaging, spectral Doppler, color Doppler, and power Doppler as needed of all accessible portions of each vessel. Bilateral testing is considered an integral part of a complete examination. Limited examinations for reoccurring indications may be performed as noted. The reflux portion of the exam is performed with the patient in reverse Trendelenburg.   +---------+---------------+---------+-----------+----------+--------------+ RIGHT    CompressibilityPhasicitySpontaneityPropertiesThrombus Aging +---------+---------------+---------+-----------+----------+--------------+ CFV      Full           Yes      Yes                                 +---------+---------------+---------+-----------+----------+--------------+ SFJ      Full                                                        +---------+---------------+---------+-----------+----------+--------------+ FV Prox  Full                                                        +---------+---------------+---------+-----------+----------+--------------+ FV Mid                  Yes      Yes                                 +---------+---------------+---------+-----------+----------+--------------+ FV Distal               Yes      Yes                                 +---------+---------------+---------+-----------+----------+--------------+ PFV      Full                                                        +---------+---------------+---------+-----------+----------+--------------+ POP      Full           Yes      Yes                                 +---------+---------------+---------+-----------+----------+--------------+ PTV      Full                                                        +---------+---------------+---------+-----------+----------+--------------+  PERO                                                  Not visualized +---------+---------------+---------+-----------+----------+--------------+   +----+---------------+---------+-----------+----------+--------------+ LEFTCompressibilityPhasicitySpontaneityPropertiesThrombus Aging +----+---------------+---------+-----------+----------+--------------+ CFV Full           Yes      Yes                                  +----+---------------+---------+-----------+----------+--------------+     Summary: RIGHT: - There is no evidence of deep vein thrombosis in the lower extremity. However, portions of this examination were limited- see technologist comments above.  - No cystic structure found in the popliteal fossa.  LEFT: - No evidence of common femoral vein obstruction.  *See table(s) above for measurements and observations.    Preliminary     EKG: Independently reviewed.  Normal sinus rhythm.  Assessment/Plan Principal Problem:   Sepsis (HCC) Active Problems:   Cellulitis of right lower extremity   Normochromic normocytic anemia   Uncontrolled type 2 diabetes mellitus with hyperglycemia (HCC)   Essential hypertension    1. Sepsis secondary to cellulitis of the right lower extremity for which patient is on empiric antibiotics follow cultures follow lactic acid continue aggressive hydration follow procalcitonin levels.  At the time of my exam there is no definite evidence of compartment syndrome will closely monitor. 2. Diabetes mellitus type 2 with hyperglycemia uncontrolled probably because patient was not taking his medications recently because of nausea and also reported sepsis.  Will check hemoglobin A1c for now keep patient on long-acting Lantus along with sliding scale and premeal insulin.  Follow metabolic panel to make sure patient is not going in DKA. 3. Hypertension we will keep patient on as needed IV hydralazine for now since patient does not remember his home medications name. 4. Nausea vomiting abdomen appears benign LFTs unremarkable.  Patient was able to eat a meal in the ER.  We will closely monitor. 5. Anemia will need further work-up.  Follow CBC.  No old labs to compare. 6. Platelet counts were not able to be comfortable repeat a CBC. 7. Hyponatremia likely from dehydration and hyperglycemia which I think will improve with fluids follow metabolic panel closely.  Since patient has septic  picture on presentation with severe cellulitis of the right lower extremities will need close monitoring for any further worsening in inpatient status.   DVT prophylaxis: Placed on heparin but will need to follow the platelet counts closely.  Repeat CBC has been ordered. Code Status: Full code. Family Communication: Discussed with patient. Disposition Plan: To be determined. Consults called: None. Admission status: Inpatient.   Eduard Clos MD [facility] Pager [phone].  If 7PM-7AM, please contact night-coverage [email or web address] Password [facility]  01/17/2020, 9:44 PM

## 2020-01-17 NOTE — Telephone Encounter (Signed)
Spoke with Chantel about [facility] Protocols related to covid symptoms. Per call if temp is over 100.4 send to ED by EMS.

## 2020-01-17 NOTE — Congregational Nurse Program (Signed)
  Dept: [phone]   Congregational Nurse Program Note  Date of Encounter: 01/17/2020  Past Medical History: No past medical history on file.  Encounter Details:  CNP Questionnaire - 01/17/20 1334      Questionnaire   Patient Status Not Applicable    Race White or Caucasian    Location Patient Served At [facility]    Uninsured Not Applicable    Food No food insecurities    Housing/Utilities No permanent housing    Transportation Yes, need transportation assistance    Interpersonal Safety No, do not feel physically and emotionally safe where you currently live    Medication No medication insecurities    Medical Provider No    Referrals Emergency Department    ED Visit Averted Not Applicable    Life-Saving Intervention Made Not Applicable          Client seen in the lobby and writer asked if he needed a blood pressure check. Client's blood pressure was elevated. While in office checked his cbg 413. Asked about medications and client reported he did not take his medication for blood pressure or diabetes today due to feeling nauseated. Other symptoms reported tiredness and chills. Checked temperature 103.1. Clients RLE is swollen and he is using a cane to ambulate. [facility] for possible Covid protocols. Instructed to call EMS to transport to [facility] ED for evaluation. Nira Conn. RN CN [phone]

## 2020-01-17 NOTE — Progress Notes (Signed)
Pharmacy Antibiotic Note  Dennis Green is a 54 y.o. male admitted on 01/17/2020 with cellulitis.  Pharmacy has been consulted for Cefepime and vancomycin dosing. WBC wnl, LA 3.6. Tm 102.25F. SCr wnl   Plan: -Cefepime 2 gm IV Q 8 hours -Vancomycin 2 gm IV load followed by vancomycin 1250 mg IV Q 8 hours -Monitor CBC, renal fx, cultures and clinical progres -VT at SS   Height: 5\' 9"  (175.3 cm) Weight: 132 kg (291 lb 0.1 oz) IBW/kg (Calculated) : 70.7  Temp (24hrs), Avg:103 F (39.4 C), Min:102.8 F (39.3 C), Max:103.1 F (39.5 C)  No results for input(s): WBC, CREATININE, LATICACIDVEN, VANCOTROUGH, VANCOPEAK, VANCORANDOM, GENTTROUGH, GENTPEAK, GENTRANDOM, TOBRATROUGH, TOBRAPEAK, TOBRARND, AMIKACINPEAK, AMIKACINTROU, AMIKACIN in the last 168 hours.  CrCl cannot be calculated (No successful lab value found.).    Allergies  Allergen Reactions  . Codeine Hives    Antimicrobials this admission: Cefepime 9/13 >>  Vanc 9/13 >>   Dose adjustments this admission:  Microbiology results: 9/13 BCx:  9/13 UCx:     Thank you for allowing pharmacy to be a part of this patient's care.  10/13, PharmD., BCPS, BCCCP Clinical Pharmacist Clinical phone for 01/17/20 until 11:30pm: [phone] If after 11:30pm, please refer to [facility] for unit-specific pharmacist

## 2020-01-17 NOTE — Progress Notes (Signed)
Lower extremity venous has been completed.   Preliminary results in CV Proc.   Blanch Media 01/17/2020 4:20 PM

## 2020-01-18 ENCOUNTER — Inpatient Hospital Stay (HOSPITAL_COMMUNITY): Payer: 59

## 2020-01-18 DIAGNOSIS — R17 Unspecified jaundice: Secondary | ICD-10-CM

## 2020-01-18 DIAGNOSIS — D649 Anemia, unspecified: Secondary | ICD-10-CM

## 2020-01-18 DIAGNOSIS — R600 Localized edema: Secondary | ICD-10-CM

## 2020-01-18 LAB — COMPREHENSIVE METABOLIC PANEL
ALT: 14 U/L (ref 0–44)
AST: 20 U/L (ref 15–41)
Albumin: 1.9 g/dL — ABNORMAL LOW (ref 3.5–5.0)
Alkaline Phosphatase: 77 U/L (ref 38–126)
Anion gap: 9 (ref 5–15)
BUN: 16 mg/dL (ref 6–20)
CO2: 20 mmol/L — ABNORMAL LOW (ref 22–32)
Calcium: 7.5 mg/dL — ABNORMAL LOW (ref 8.9–10.3)
Chloride: 98 mmol/L (ref 98–111)
Creatinine, Ser: 0.9 mg/dL (ref 0.61–1.24)
GFR calc Af Amer: >60 mL/min (ref >60)
GFR calc non Af Amer: >60 mL/min (ref >60)
Glucose, Bld: 254 mg/dL — ABNORMAL HIGH (ref 70–99)
Potassium: 3.8 mmol/L (ref 3.5–5.1)
Sodium: 127 mmol/L — ABNORMAL LOW (ref 135–145)
Total Bilirubin: 3 mg/dL — ABNORMAL HIGH (ref 0.3–1.2)
Total Protein: 5.5 g/dL — ABNORMAL LOW (ref 6.5–8.1)

## 2020-01-18 LAB — VITAMIN B12: Vitamin B-12: 1021 pg/mL — ABNORMAL HIGH (ref 180–914)

## 2020-01-18 LAB — BASIC METABOLIC PANEL
Anion gap: 10 (ref 5–15)
BUN: 17 mg/dL (ref 6–20)
CO2: 19 mmol/L — ABNORMAL LOW (ref 22–32)
Calcium: 7.6 mg/dL — ABNORMAL LOW (ref 8.9–10.3)
Chloride: 98 mmol/L (ref 98–111)
Creatinine, Ser: 0.87 mg/dL (ref 0.61–1.24)
GFR calc Af Amer: >60 mL/min (ref >60)
GFR calc non Af Amer: >60 mL/min (ref >60)
Glucose, Bld: 231 mg/dL — ABNORMAL HIGH (ref 70–99)
Potassium: 3.9 mmol/L (ref 3.5–5.1)
Sodium: 127 mmol/L — ABNORMAL LOW (ref 135–145)

## 2020-01-18 LAB — LACTIC ACID, PLASMA
Lactic Acid, Venous: 2.5 mmol/L (ref 0.5–1.9)
Lactic Acid, Venous: 2.6 mmol/L (ref 0.5–1.9)
Lactic Acid, Venous: 2.7 mmol/L (ref 0.5–1.9)

## 2020-01-18 LAB — CBC WITH DIFFERENTIAL/PLATELET
Abs Immature Granulocytes: 0.05 10*3/uL (ref 0.00–0.07)
Basophils Absolute: 0 10*3/uL (ref 0.0–0.1)
Basophils Relative: 0 %
Eosinophils Absolute: 0 10*3/uL (ref 0.0–0.5)
Eosinophils Relative: 0 %
HCT: 32.6 % — ABNORMAL LOW (ref 39.0–52.0)
Hemoglobin: 10.9 g/dL — ABNORMAL LOW (ref 13.0–17.0)
Immature Granulocytes: 1 %
Lymphocytes Relative: 10 %
Lymphs Abs: 0.7 10*3/uL (ref 0.7–4.0)
MCH: 30.4 pg (ref 26.0–34.0)
MCHC: 33.4 g/dL (ref 30.0–36.0)
MCV: 91.1 fL (ref 80.0–100.0)
Monocytes Absolute: 0.5 10*3/uL (ref 0.1–1.0)
Monocytes Relative: 8 %
Neutro Abs: 5.4 10*3/uL (ref 1.7–7.7)
Neutrophils Relative %: 81 %
Platelets: 58 10*3/uL — ABNORMAL LOW (ref 150–400)
RBC: 3.58 MIL/uL — ABNORMAL LOW (ref 4.22–5.81)
RDW: 13.1 % (ref 11.5–15.5)
WBC: 6.6 10*3/uL (ref 4.0–10.5)
nRBC: 0 % (ref 0.0–0.2)

## 2020-01-18 LAB — FOLATE: Folate: 13.3 ng/mL (ref >5.9)

## 2020-01-18 LAB — ECHOCARDIOGRAM COMPLETE
Area-P 1/2: 3.17 cm2
Height: 69 in
S' Lateral: 3.5 cm
Weight: 4656.12 oz

## 2020-01-18 LAB — GLUCOSE, CAPILLARY
Glucose-Capillary: 216 mg/dL — ABNORMAL HIGH (ref 70–99)
Glucose-Capillary: 194 mg/dL — ABNORMAL HIGH (ref 70–99)

## 2020-01-18 LAB — PLATELET COUNT
Platelets: 55 10*3/uL — ABNORMAL LOW (ref 150–400)
Platelets: 76 10*3/uL — ABNORMAL LOW (ref 150–400)

## 2020-01-18 LAB — C-REACTIVE PROTEIN: CRP: 16.7 mg/dL — ABNORMAL HIGH (ref <1.0)

## 2020-01-18 LAB — RETICULOCYTES
Immature Retic Fract: 22.7 % — ABNORMAL HIGH (ref 2.3–15.9)
RBC.: 2.99 MIL/uL — ABNORMAL LOW (ref 4.22–5.81)
Retic Count, Absolute: 54.1 10*3/uL (ref 19.0–186.0)
Retic Ct Pct: 1.8 % (ref 0.4–3.1)

## 2020-01-18 LAB — SAVE SMEAR(SSMR), FOR PROVIDER SLIDE REVIEW

## 2020-01-18 LAB — IRON AND TIBC
Iron: 20 ug/dL — ABNORMAL LOW (ref 45–182)
Saturation Ratios: 11 % — ABNORMAL LOW (ref 17.9–39.5)
TIBC: 189 ug/dL — ABNORMAL LOW (ref 250–450)
UIBC: 169 ug/dL

## 2020-01-18 LAB — TSH: TSH: 1.227 u[IU]/mL (ref 0.350–4.500)

## 2020-01-18 LAB — CBG MONITORING, ED
Glucose-Capillary: 226 mg/dL — ABNORMAL HIGH (ref 70–99)
Glucose-Capillary: 235 mg/dL — ABNORMAL HIGH (ref 70–99)
Glucose-Capillary: 233 mg/dL — ABNORMAL HIGH (ref 70–99)

## 2020-01-18 LAB — URINE CULTURE: Culture: 30000 — AB

## 2020-01-18 LAB — BRAIN NATRIURETIC PEPTIDE: B Natriuretic Peptide: 648.6 pg/mL — ABNORMAL HIGH (ref 0.0–100.0)

## 2020-01-18 LAB — FERRITIN: Ferritin: 257 ng/mL (ref 24–336)

## 2020-01-18 LAB — FIBRINOGEN: Fibrinogen: 595 mg/dL — ABNORMAL HIGH (ref 210–475)

## 2020-01-18 LAB — IMMATURE PLATELET FRACTION: Immature Platelet Fraction: 6.2 % (ref 1.2–8.6)

## 2020-01-18 LAB — D-DIMER, QUANTITATIVE: D-Dimer, Quant: 2.18 ug/mL-FEU — ABNORMAL HIGH (ref 0.00–0.50)

## 2020-01-18 LAB — SEDIMENTATION RATE: Sed Rate: 44 mm/hr — ABNORMAL HIGH (ref 0–16)

## 2020-01-18 MED ORDER — SODIUM CHLORIDE 0.9 % IV SOLN: INTRAVENOUS | Status: DC

## 2020-01-18 MED ORDER — INSULIN ASPART 100 UNIT/ML ~~LOC~~ SOLN
0.0000 [IU] | Freq: Three times a day (TID) | SUBCUTANEOUS | Status: DC
  Administered 2020-01-18: 7 [IU] via SUBCUTANEOUS
  Administered 2020-01-19: 4 [IU] via SUBCUTANEOUS
  Administered 2020-01-19 (×2): 7 [IU] via SUBCUTANEOUS
  Administered 2020-01-20: 4 [IU] via SUBCUTANEOUS
  Administered 2020-01-20: 7 [IU] via SUBCUTANEOUS
  Administered 2020-01-21 (×2): 4 [IU] via SUBCUTANEOUS
  Administered 2020-01-22 – 2020-01-23 (×3): 3 [IU] via SUBCUTANEOUS
  Administered 2020-01-24: 7 [IU] via SUBCUTANEOUS
  Administered 2020-01-25 – 2020-01-26 (×3): 4 [IU] via SUBCUTANEOUS
  Administered 2020-01-26: 3 [IU] via SUBCUTANEOUS

## 2020-01-18 MED ORDER — INSULIN ASPART 100 UNIT/ML ~~LOC~~ SOLN
0.0000 [IU] | Freq: Every day | SUBCUTANEOUS | Status: DC
  Administered 2020-01-18: 2 [IU] via SUBCUTANEOUS

## 2020-01-18 MED ORDER — SODIUM CHLORIDE 0.9 % IV SOLN
2.0000 g | Freq: Three times a day (TID) | INTRAVENOUS | Status: DC
  Administered 2020-01-18 – 2020-01-21 (×9): 2 g via INTRAVENOUS
  Filled 2020-01-18 (×9): qty 2

## 2020-01-18 MED ORDER — VANCOMYCIN HCL 1250 MG/250ML IV SOLN
1250.0000 mg | Freq: Two times a day (BID) | INTRAVENOUS | Status: DC
  Administered 2020-01-18 – 2020-01-21 (×6): 1250 mg via INTRAVENOUS
  Filled 2020-01-18 (×7): qty 250

## 2020-01-18 MED ORDER — METRONIDAZOLE IN NACL 5-0.79 MG/ML-% IV SOLN
500.0000 mg | Freq: Three times a day (TID) | INTRAVENOUS | Status: DC
  Administered 2020-01-18 – 2020-01-21 (×11): 500 mg via INTRAVENOUS
  Filled 2020-01-18 (×12): qty 100

## 2020-01-18 NOTE — Progress Notes (Signed)
   01/18/20 2328  Assess: MEWS Score  Temp 98.4 F (36.9 C)  BP (!) 148/59  Pulse Rate 84  ECG Heart Rate 84  Resp (!) 28  SpO2 100 %  O2 Device Room Air  Assess: MEWS Score  MEWS Temp 0  MEWS Systolic 0  MEWS Pulse 0  MEWS RR 2  MEWS LOC 0  MEWS Score 2  MEWS Score Color Yellow  Assess: if the MEWS score is Yellow or Red  Were vital signs taken at a resting state? Yes  Focused Assessment No change from prior assessment  Early Detection of Sepsis Score *See Row Information* Low  MEWS guidelines implemented *See Row Information* Yes  Treat  MEWS Interventions Escalated (See documentation below)  Pain Scale 0-10  Pain Score 0  Escalate  MEWS: Escalate Yellow: discuss with charge nurse/RN and consider discussing with provider and RRT  Notify: Charge Nurse/RN  Name of Charge Nurse/RN Notified Adolphus Birchwood, RN  Date Charge Nurse/RN Notified 01/18/20  Time Charge Nurse/RN Notified 2340  Document  Patient Outcome Stabilized after interventions  Progress note created (see row info) Yes

## 2020-01-18 NOTE — ED Notes (Signed)
Patient transported to Ultrasound 

## 2020-01-18 NOTE — Progress Notes (Signed)
Pharmacy Antibiotic Note  Britten Seyfried is a 54 y.o. male admitted on 01/17/2020 with cellulitis.  Pharmacy has been consulted for Cefepime and vancomycin dosing. Pt is febrile with Tmax 103.1 and WBC is WNL. Scr is WNL and lactic acid is elevated.   Plan: Continue cefepime 2gm IV Q8H Change vancomycin to 1250mg  IV Q12H F/u renal fxn, C&S, clinical status and trough at SS Narrow antibiotics as able  Height: 5\' 9"  (175.3 cm) Weight: 132 kg (291 lb 0.1 oz) IBW/kg (Calculated) : 70.7  Temp (24hrs), Avg:102.2 F (39 C), Min:100.7 F (38.2 C), Max:103.1 F (39.5 C)  Recent Labs  Lab 01/17/20 1515 01/17/20 2108 01/17/20 2209 01/18/20 0007 01/18/20 0440 01/18/20 0500  WBC 7.7  --  6.5  --   --  6.6  CREATININE 1.04  --  1.02  --   --  0.90  LATICACIDVEN 3.6* 2.5*  --  2.7* 2.6*  --     Estimated Creatinine Clearance: 123.4 mL/min (by C-G formula based on SCr of 0.9 mg/dL).    Allergies  Allergen Reactions  . Codeine Hives  . Hydrochlorothiazide Itching    Antimicrobials this admission: Cefepime 9/13 >>  Vanc 9/13 >>  Flagyl 9/13>>  Dose adjustments this admission:  Microbiology results: 9/13 BCx: NGTD 9/13 UCx:     Thank you for allowing pharmacy to be a part of this patient's care.  10/13, PharmD, BCPS Clinical Pharmacist Please see AMION for all pharmacy numbers 01/18/2020 8:47 AM

## 2020-01-18 NOTE — Progress Notes (Signed)
  Echocardiogram 2D Echocardiogram has been performed.  Dorena Dew Darthy Manganelli 01/18/2020, 4:53 PM

## 2020-01-18 NOTE — Progress Notes (Signed)
PROGRESS NOTE    Dennis Green  HQI:[id number] DOB: January 31, 1966 DOA: 01/17/2020 PCP: Patient, No Pcp Per    Chief Complaint  Patient presents with  . multiple complaints    Swiollen legs, pain in calf musles, diarhea, difficulty breathing , high bp, high blood sugar, pt has not taken meds (metformin)    Brief Narrative:  54 year old gentleman with prior history of hypertension, type 2 diabetes mellitus reports worsening pain in the right lower extremity over the past 2 weeks also reports bilateral leg edema for over a month.  Patient currently denies any chest pain or shortness of breath at this time.  On arrival to ED he was found to be febrile, elevated lactic acid,  CT of the right leg shows features consistent with diffuse cellulitis of the leg.  Blood cultures were drawn and sepsis protocol was initiated.  Venous duplex was ordered and is negative for acute DVT.  Assessment & Plan:   Principal Problem:   Sepsis (HCC) Active Problems:   Cellulitis of right lower extremity   Normochromic normocytic anemia   Uncontrolled type 2 diabetes mellitus with hyperglycemia (HCC)   Essential hypertension  Sepsis secondary to cellulitis of the right lower extremity: Sepsis physiology is improving but not resolved.  Lactic acid persistently high at 2.5,  Pro calcitonin is 0.77.  Started him on broad spectrum IV antibiotics , IV Cefepime, IV vancomycin and IV flagyl.  Venous duplex of the lower extremity is negative for DVT.     Essential hypertension:  Suboptimally controlled. Continue to monitor. Restart cozaar once sepsis physiology improved.    Type 2 DM:  CBG (last 3)  Recent Labs    01/17/20 2202 01/18/20 0759 01/18/20 1209  GLUCAP 282* 226* 235*   Uncontrolled with hyperglycemia.  A1c is 8.8 Continue with Lantus 35 units at bedside, change to resistant SSI, and resume novolog 5 units TIDAC.    Acute hyponatremia with mild metabolic acidosis:  Probably secondary to  dehydration and hyperglycemia.  Hydrate and repeat BMP .  Monitor sodium tonight and tomorrow.    Mild anemia of chronic disease/ Normocytic anemia:  - hemoglobin  around 11.    Acute thrombocytopenia:  Platelet clumps earlier , repeat levels around 58,000.  Close monitoring on platelets as pt is on heparin sq prophylaxis. Check DIC panel.    Elevated bilirubin, elevated thrombin time, PTT, and thrombocytopenia, low albumin. Will get US liver to check for liver cirrhosis.   Body mass index is 42.97 kg/m. Morbid Obesity Recommend outpatient follow up with PCP for weight loss and life style changes.  Pt reports he does not have PCP in [place] at this time. TOC consulted for PCP needs.    Bilateral lower extremity edema, right more than left:  Pt able to flex and extend the right foot without any pain.  Watch for compartment syndrome.  Check echocardiogram for evaluate for CHF.  Check BNP.    DVT prophylaxis: heparin sq Code Status: (Full code) Family Communication: (none at bedside) Disposition:   Status is: Inpatient  Remains inpatient appropriate because:Ongoing diagnostic testing needed not appropriate for outpatient work up, IV treatments appropriate due to intensity of illness or inability to take PO and Inpatient level of care appropriate due to severity of illness   Dispo: The patient is from: Home              Anticipated d/c is to: pending  Anticipated d/c date is: 3 days              Patient currently is not medically stable to d/c.       Consultants:   None.    Procedures: venous duplex of the lower extremities.   Antimicrobials:  Antibiotics Given (last 72 hours)    Date/Time Action Medication Dose Rate   01/17/20 1502 New Bag/Given   ceFEPIme (MAXIPIME) 2 g in sodium chloride 0.9 % 100 mL IVPB 2 g 200 mL/hr   01/17/20 1508 New Bag/Given   metroNIDAZOLE (FLAGYL) IVPB 500 mg 500 mg 100 mL/hr   01/17/20 1544 New Bag/Given    vancomycin (VANCOREADY) IVPB 2000 mg/400 mL 2,000 mg 200 mL/hr   01/18/20 0002 New Bag/Given   ceFEPIme (MAXIPIME) 1 g in sodium chloride 0.9 % 100 mL IVPB 1 g 200 mL/hr   01/18/20 0056 New Bag/Given   vancomycin (VANCOREADY) IVPB 1250 mg/250 mL 1,250 mg 166.7 mL/hr   01/18/20 0648 New Bag/Given   metroNIDAZOLE (FLAGYL) IVPB 500 mg 500 mg 100 mL/hr   01/18/20 0109 New Bag/Given   ceFEPIme (MAXIPIME) 1 g in sodium chloride 0.9 % 100 mL IVPB 1 g 200 mL/hr   01/18/20 0823 New Bag/Given   vancomycin (VANCOREADY) IVPB 1250 mg/250 mL 1,250 mg 166.7 mL/hr       Subjective: Right leg pain controlled.   Objective: Vitals:   01/18/20 0830 01/18/20 0900 01/18/20 1000 01/18/20 1100  BP: (!) 145/43 (!) 148/75 (!) 149/71 (!) 161/56  Pulse: 76 73 73 74  Resp: 15  (!) 22 (!) 36  Temp:      TempSrc:      SpO2: 98% 100% 100% 100%  Weight:      Height:        Intake/Output Summary (Last 24 hours) at 01/18/2020 1145 Last data filed at 01/18/2020 0857 Gross per 24 hour  Intake 547.02 ml  Output --  Net 547.02 ml   Filed Weights   01/17/20 1427  Weight: 132 kg    Examination:  General exam: Morbidly obese gentleman Respiratory system: Clear to auscultation. Respiratory effort normal. Cardiovascular system: S1 & S2 heard, RRR. No JVD,3+ leg edema.  Gastrointestinal system: Abdomen is nondistended, soft and nontender.Normal bowel sounds heard. Central nervous system: Alert and oriented. No focal neurological deficits. Extremities: bilateral leg edema 3+ right > left . Erythema and tenderness present.  Skin: mild erythema of the right lower extremity on the right lower leg.  Psychiatry:  Mood is appropriate.     Data Reviewed: I have personally reviewed following labs and imaging studies  CBC: Recent Labs  Lab 01/17/20 1515 01/17/20 2209 01/18/20 0500 01/18/20 0912  WBC 7.7 6.5 6.6  --   NEUTROABS 6.5  --  5.4  --   HGB 11.1* 11.0* 10.9*  --   HCT 33.3* 32.8* 32.6*  --   MCV  90.7 91.1 91.1  --   PLT PLATELET CLUMPS NOTED ON SMEAR, UNABLE TO ESTIMATE PLATELET CLUMPS NOTED ON SMEAR, UNABLE TO ESTIMATE 58* 55*    Basic Metabolic Panel: Recent Labs  Lab 01/17/20 1515 01/17/20 2209 01/18/20 0500  NA 123*  --  127*  K 4.2  --  3.8  CL 93*  --  98  CO2 16*  --  20*  GLUCOSE 377*  --  254*  BUN 20  --  16  CREATININE 1.04 1.02 0.90  CALCIUM 7.4*  --  7.5*    GFR: Estimated Creatinine  Clearance: 123.4 mL/min (by C-G formula based on SCr of 0.9 mg/dL).  Liver Function Tests: Recent Labs  Lab 01/17/20 1515 01/18/20 0500  AST 24 20  ALT 14 14  ALKPHOS 99 77  BILITOT 3.4* 3.0*  PROT 5.6* 5.5*  ALBUMIN 2.0* 1.9*    CBG: Recent Labs  Lab 01/17/20 1412 01/17/20 1607 01/17/20 2202 01/18/20 0759  GLUCAP 360* 336* 282* 226*     Recent Results (from the past 240 hour(s))  Blood Culture (routine x 2)     Status: None (Preliminary result)   Collection Time: 01/17/20  2:30 PM   Specimen: BLOOD  Result Value Ref Range Status   Specimen Description BLOOD LEFT ANTECUBITAL  Final   Special Requests   Final    BOTTLES DRAWN AEROBIC AND ANAEROBIC Blood Culture results may not be optimal due to an inadequate volume of blood received in culture bottles   Culture   Final    NO GROWTH < 24 HOURS Performed at [facility], [address], [place], [place] [address]    Report Status PENDING  Incomplete  Blood Culture (routine x 2)     Status: None (Preliminary result)   Collection Time: 01/17/20  2:30 PM   Specimen: BLOOD  Result Value Ref Range Status   Specimen Description BLOOD RIGHT ANTECUBITAL  Final   Special Requests   Final    BOTTLES DRAWN AEROBIC AND ANAEROBIC Blood Culture adequate volume   Culture   Final    NO GROWTH < 24 HOURS Performed at [facility], [address], [place], [place] [address]    Report Status PENDING  Incomplete  SARS Coronavirus 2 by RT PCR (hospital order, performed in [facility] hospital lab)  Nasopharyngeal Nasopharyngeal Swab     Status: None   Collection Time: 01/17/20  3:17 PM   Specimen: Nasopharyngeal Swab  Result Value Ref Range Status   SARS Coronavirus 2 NEGATIVE NEGATIVE Final    Comment: (NOTE) SARS-CoV-2 target nucleic acids are NOT DETECTED.  The SARS-CoV-2 RNA is generally detectable in upper and lower respiratory specimens during the acute phase of infection. The lowest concentration of SARS-CoV-2 viral copies this assay can detect is 250 copies / mL. A negative result does not preclude SARS-CoV-2 infection and should not be used as the sole basis for treatment or other patient management decisions.  A negative result may occur with improper specimen collection / handling, submission of specimen other than nasopharyngeal swab, presence of viral mutation(s) within the areas targeted by this assay, and inadequate number of viral copies (<250 copies / mL). A negative result must be combined with clinical observations, patient history, and epidemiological information.  Fact Sheet for Patients:   BoilerBrush.com.cy  Fact Sheet for Healthcare Providers: https://pope.com/  This test is not yet approved or  cleared by the [place] FDA and has been authorized for detection and/or diagnosis of SARS-CoV-2 by FDA under an Emergency Use Authorization (EUA).  This EUA will remain in effect (meaning this test can be used) for the duration of the COVID-19 declaration under Section 564(b)(1) of the Act, 21 U.S.C. section 360bbb-3(b)(1), unless the authorization is terminated or revoked sooner.  Performed at [facility], [address], [place], [place] [address]          Radiology Studies: CT FEMUR RIGHT W CONTRAST  Result Date: 01/17/2020 CLINICAL DATA:  Pain and swelling in the right leg for several weeks EXAM: CT OF THE LOWER  RIGHT EXTREMITY WITH CONTRAST TECHNIQUE: Multidetector CT imaging of the  lower right extremity was performed according to the standard protocol following intravenous contrast administration. COMPARISON:  None. CONTRAST:  OMNIPAQUE IOHEXOL 300 MG/ML  SOLN FINDINGS: Bones/Joint/Cartilage No fracture or dislocation. Mild right hip osteoarthritis is seen with joint space loss and marginal osteophyte formation. Medial compartment osteoarthritis seen at the knee with joint space loss. No areas of cortical destruction or periosteal reaction are noted. No large knee, ankle, or hip joint effusion is seen. Ligaments Suboptimally assessed by CT. Muscles and Tendons Mild fatty atrophy seen within the muscles surrounding the femur and lower extremity. No focal atrophy or tear however is seen. The visualized portion the tendons intact. Soft tissues There is diffuse subcutaneous edema and skin thickening seen the anterolateral of the femur and extremity. There tortuous superficial varicosities noted. Edema is seen also within the popliteal fossa. No loculated fluid collection or subcutaneous emphysema is seen. IMPRESSION: Diffuse subcutaneous edema and skin thickening seen predominantly along the anterolateral aspect of the lower extremity which is likely due to diffuse cellulitis. No loculated fluid collections or evidence of osteomyelitis. Edema within the popliteal fossa. Electronically Signed   By: Jonna Clark M.D.   On: 01/17/2020 18:28   CT TIBIA FIBULA RIGHT W CONTRAST  Result Date: 01/17/2020 CLINICAL DATA:  Pain and swelling in the right leg for several weeks EXAM: CT OF THE LOWER RIGHT EXTREMITY WITH CONTRAST TECHNIQUE: Multidetector CT imaging of the lower right extremity was performed according to the standard protocol following intravenous contrast administration. COMPARISON:  None. CONTRAST:  OMNIPAQUE IOHEXOL 300 MG/ML  SOLN FINDINGS: Bones/Joint/Cartilage No fracture or dislocation. Mild right hip osteoarthritis is seen with joint space loss and marginal osteophyte  formation. Medial compartment osteoarthritis seen at the knee with joint space loss. No areas of cortical destruction or periosteal reaction are noted. No large knee, ankle, or hip joint effusion is seen. Ligaments Suboptimally assessed by CT. Muscles and Tendons Mild fatty atrophy seen within the muscles surrounding the femur and lower extremity. No focal atrophy or tear however is seen. The visualized portion the tendons intact. Soft tissues There is diffuse subcutaneous edema and skin thickening seen the anterolateral of the femur and extremity. There tortuous superficial varicosities noted. Edema is seen also within the popliteal fossa. No loculated fluid collection or subcutaneous emphysema is seen. IMPRESSION: Diffuse subcutaneous edema and skin thickening seen predominantly along the anterolateral aspect of the lower extremity which is likely due to diffuse cellulitis. No loculated fluid collections or evidence of osteomyelitis. Edema within the popliteal fossa. Electronically Signed   By: Jonna Clark M.D.   On: 01/17/2020 18:28   DG Chest Port 1 View  Result Date: 01/17/2020 CLINICAL DATA:  Questionable sepsis EXAM: PORTABLE CHEST 1 VIEW COMPARISON:  None. FINDINGS: The heart size and mediastinal contours are within normal limits. There are mild to moderate diffuse bilateral interstitial opacities. No pleural effusion or pneumothorax. The visualized skeletal structures are unremarkable. IMPRESSION: Mild to moderate diffuse bilateral interstitial opacities, which may reflect pulmonary edema or atypical infection. Electronically Signed   By: Romona Curls M.D.   On: 01/17/2020 15:14   VAS [place] LOWER EXTREMITY VENOUS (DVT) (ONLY [facility] & [facility] 7a-7p)  Result Date: 01/17/2020  Lower Venous DVTStudy Indications: Edema.  Limitations: Body habitus and poor ultrasound/tissue interface. Comparison Study: no prior Performing Technologist: Blanch Media RVS  Examination Guidelines: A complete evaluation includes B-mode  imaging, spectral Doppler, color Doppler, and power Doppler  as needed of all accessible portions of each vessel. Bilateral testing is considered an integral part of a complete examination. Limited examinations for reoccurring indications may be performed as noted. The reflux portion of the exam is performed with the patient in reverse Trendelenburg.  +---------+---------------+---------+-----------+----------+--------------+ RIGHT    CompressibilityPhasicitySpontaneityPropertiesThrombus Aging +---------+---------------+---------+-----------+----------+--------------+ CFV      Full           Yes      Yes                                 +---------+---------------+---------+-----------+----------+--------------+ SFJ      Full                                                        +---------+---------------+---------+-----------+----------+--------------+ FV Prox  Full                                                        +---------+---------------+---------+-----------+----------+--------------+ FV Mid                  Yes      Yes                                 +---------+---------------+---------+-----------+----------+--------------+ FV Distal               Yes      Yes                                 +---------+---------------+---------+-----------+----------+--------------+ PFV      Full                                                        +---------+---------------+---------+-----------+----------+--------------+ POP      Full           Yes      Yes                                 +---------+---------------+---------+-----------+----------+--------------+ PTV      Full                                                        +---------+---------------+---------+-----------+----------+--------------+ PERO                                                  Not visualized +---------+---------------+---------+-----------+----------+--------------+    +----+---------------+---------+-----------+----------+--------------+ LEFTCompressibilityPhasicitySpontaneityPropertiesThrombus Aging +----+---------------+---------+-----------+----------+--------------+ CFV Full  Yes      Yes                                 +----+---------------+---------+-----------+----------+--------------+     Summary: RIGHT: - There is no evidence of deep vein thrombosis in the lower extremity. However, portions of this examination were limited- see technologist comments above.  - No cystic structure found in the popliteal fossa.  LEFT: - No evidence of common femoral vein obstruction.  *See table(s) above for measurements and observations.    Preliminary         Scheduled Meds: . heparin  5,000 Units Subcutaneous Q8H  . insulin aspart  0-9 Units Subcutaneous TID WC  . insulin aspart  5 Units Subcutaneous TID WC  . insulin glargine  25 Units Subcutaneous QHS   Continuous Infusions: . ceFEPime (MAXIPIME) IV    . metronidazole Stopped (01/18/20 0803)  . vancomycin       LOS: 1 day       Kathlen Mody, MD [facility]   To contact the attending provider between 7A-7P or the covering provider during after hours 7P-7A, please log into the web site [email or web address] and access using universal [place] password for that web site. If you do not have the password, please call the hospital operator.  01/18/2020, 11:45 AM

## 2020-01-19 ENCOUNTER — Encounter: Payer: Self-pay | Admitting: Physician Assistant

## 2020-01-19 DIAGNOSIS — R6520 Severe sepsis without septic shock: Secondary | ICD-10-CM

## 2020-01-19 LAB — HEPATITIS PANEL, ACUTE
HCV Ab: NONREACTIVE
Hep A IgM: NONREACTIVE
Hep B C IgM: NONREACTIVE
Hepatitis B Surface Ag: NONREACTIVE

## 2020-01-19 LAB — BASIC METABOLIC PANEL
Anion gap: 7 (ref 5–15)
BUN: 19 mg/dL (ref 6–20)
CO2: 21 mmol/L — ABNORMAL LOW (ref 22–32)
Calcium: 7.5 mg/dL — ABNORMAL LOW (ref 8.9–10.3)
Chloride: 101 mmol/L (ref 98–111)
Creatinine, Ser: 0.8 mg/dL (ref 0.61–1.24)
GFR calc Af Amer: >60 mL/min (ref >60)
GFR calc non Af Amer: >60 mL/min (ref >60)
Glucose, Bld: 202 mg/dL — ABNORMAL HIGH (ref 70–99)
Potassium: 4.2 mmol/L (ref 3.5–5.1)
Sodium: 129 mmol/L — ABNORMAL LOW (ref 135–145)

## 2020-01-19 LAB — CBC WITH DIFFERENTIAL/PLATELET
Abs Immature Granulocytes: 0.07 10*3/uL (ref 0.00–0.07)
Basophils Absolute: 0 10*3/uL (ref 0.0–0.1)
Basophils Relative: 0 %
Eosinophils Absolute: 0 10*3/uL (ref 0.0–0.5)
Eosinophils Relative: 0 %
HCT: 36.5 % — ABNORMAL LOW (ref 39.0–52.0)
Hemoglobin: 12.3 g/dL — ABNORMAL LOW (ref 13.0–17.0)
Immature Granulocytes: 1 %
Lymphocytes Relative: 9 %
Lymphs Abs: 0.7 10*3/uL (ref 0.7–4.0)
MCH: 30.3 pg (ref 26.0–34.0)
MCHC: 33.7 g/dL (ref 30.0–36.0)
MCV: 89.9 fL (ref 80.0–100.0)
Monocytes Absolute: 0.6 10*3/uL (ref 0.1–1.0)
Monocytes Relative: 7 %
Neutro Abs: 7 10*3/uL (ref 1.7–7.7)
Neutrophils Relative %: 83 %
Platelets: 61 10*3/uL — ABNORMAL LOW (ref 150–400)
RBC: 4.06 MIL/uL — ABNORMAL LOW (ref 4.22–5.81)
RDW: 13 % (ref 11.5–15.5)
WBC: 8.5 10*3/uL (ref 4.0–10.5)
nRBC: 0 % (ref 0.0–0.2)

## 2020-01-19 LAB — COMPREHENSIVE METABOLIC PANEL
ALT: 16 U/L (ref 0–44)
AST: 34 U/L (ref 15–41)
Albumin: 1.9 g/dL — ABNORMAL LOW (ref 3.5–5.0)
Alkaline Phosphatase: 82 U/L (ref 38–126)
Anion gap: 10 (ref 5–15)
BUN: 17 mg/dL (ref 6–20)
CO2: 19 mmol/L — ABNORMAL LOW (ref 22–32)
Calcium: 7.6 mg/dL — ABNORMAL LOW (ref 8.9–10.3)
Chloride: 99 mmol/L (ref 98–111)
Creatinine, Ser: 0.9 mg/dL (ref 0.61–1.24)
GFR calc Af Amer: >60 mL/min (ref >60)
GFR calc non Af Amer: >60 mL/min (ref >60)
Glucose, Bld: 182 mg/dL — ABNORMAL HIGH (ref 70–99)
Potassium: 3.8 mmol/L (ref 3.5–5.1)
Sodium: 128 mmol/L — ABNORMAL LOW (ref 135–145)
Total Bilirubin: 2.2 mg/dL — ABNORMAL HIGH (ref 0.3–1.2)
Total Protein: 5.7 g/dL — ABNORMAL LOW (ref 6.5–8.1)

## 2020-01-19 LAB — GLUCOSE, CAPILLARY
Glucose-Capillary: 177 mg/dL — ABNORMAL HIGH (ref 70–99)
Glucose-Capillary: 180 mg/dL — ABNORMAL HIGH (ref 70–99)
Glucose-Capillary: 233 mg/dL — ABNORMAL HIGH (ref 70–99)
Glucose-Capillary: 209 mg/dL — ABNORMAL HIGH (ref 70–99)
Glucose-Capillary: 160 mg/dL — ABNORMAL HIGH (ref 70–99)

## 2020-01-19 MED ORDER — LOSARTAN POTASSIUM 50 MG PO TABS
50.0000 mg | ORAL_TABLET | Freq: Every day | ORAL | Status: DC
  Administered 2020-01-19 – 2020-01-26 (×8): 50 mg via ORAL
  Filled 2020-01-19 (×9): qty 1

## 2020-01-19 MED ORDER — PNEUMOCOCCAL VAC POLYVALENT 25 MCG/0.5ML IJ INJ
0.5000 mL | INJECTION | INTRAMUSCULAR | Status: AC
  Administered 2020-01-26: 0.5 mL via INTRAMUSCULAR
  Filled 2020-01-19: qty 0.5

## 2020-01-19 NOTE — Progress Notes (Addendum)
[facility]  PROGRESS NOTE  Dennis Green ZOX:[id number] DOB: 02-26-1966 DOA: 01/17/2020 PCP: Patient, No Pcp Per   Brief HPI:   54 year old male with history of hypertension, diabetes mellitus type 2, presented with worsening pain right lower extremity for past 2 weeks.  Also reports bilateral leg edema for a month.  Denied chest pain or shortness of breath.  In the ED he was found to have elevated lactic acid.  CT of the right leg showed features consistent with diffuse cellulitis of the leg.  Blood cultures were drawn and sepsis protocol was initiated.  Venous duplex of lower extremities was negative for acute DVT.    Subjective   Patient seen and examined, feels better though still has redness and pain in the right leg.   Assessment/Plan:     1. Sepsis-secondary to cellulitis of right posterior thigh, sepsis physiology has resolved.  Continue IV antibiotics including vancomycin, Flagyl, cefepime.  Venous duplex of lower extremities negative for DVT. 2. Hypertension-blood pressure is now elevated, restart Cozaar 100 mg p.o. daily. 3. Diabetes mellitus type 2-hemoglobin A1c was 8.8.  Continue Lantus 35 units subcu daily.  Resistant sliding scale.  NovoLog 5 units 3 times daily with meals.  Blood glucose is fairly well controlled. 4. Anemia of chronic disease/normocytic anemia-hemoglobin stable at 11. 5. Liver cirrhosis /thrombocytopenia-platelet count 61,000 today.  Abdominal ultrasound shows cirrhotic liver.  Patient denies heavy alcohol use in the past.  No previous history of hepatitis.  Will check hepatitis panel.  Will need GI evaluation as outpatient.  No signs of hepatic decompensation at this time.  LFTs are normal.patient has elevated thrombin time, hypoalbuminemia, thrombocytopenia, abdominal ultrasound shows cirrhotic liver.  Unclear etiology, called [facility], patient will get an appointment as outpatient 6. Hyponatremia-sodium 128, likely from underlying liver  cirrhosis.   COVID-19 Labs  Recent Labs    01/18/20 1212  DDIMER 2.18*  FERRITIN 257  CRP 16.7*    Lab Results  Component Value Date   SARSCOV2NAA NEGATIVE 01/17/2020     Scheduled medications:   . insulin aspart  0-20 Units Subcutaneous TID WC  . insulin aspart  0-5 Units Subcutaneous QHS  . insulin aspart  5 Units Subcutaneous TID WC  . insulin glargine  25 Units Subcutaneous QHS    CBG: Recent Labs  Lab 01/18/20 2142 01/18/20 2325 01/19/20 0521 01/19/20 0809 01/19/20 1157  GLUCAP 216* 194* 177* 180* 233*    SpO2: 98 %    CBC: Recent Labs  Lab 01/17/20 1515 01/17/20 1515 01/17/20 2209 01/18/20 0500 01/18/20 0912 01/18/20 2043 01/19/20 1004  WBC 7.7  --  6.5 6.6  --   --  8.5  NEUTROABS 6.5  --   --  5.4  --   --  7.0  HGB 11.1*  --  11.0* 10.9*  --   --  12.3*  HCT 33.3*  --  32.8* 32.6*  --   --  36.5*  MCV 90.7  --  91.1 91.1  --   --  89.9  PLT PLATELET CLUMPS NOTED ON SMEAR, UNABLE TO ESTIMATE   < > PLATELET CLUMPS NOTED ON SMEAR, UNABLE TO ESTIMATE 58* 55* 76* 61*   < > = values in this interval not displayed.    Basic Metabolic Panel: Recent Labs  Lab 01/17/20 1515 01/17/20 2209 01/18/20 0500 01/18/20 2043 01/19/20 1004  NA 123*  --  127* 127* 128*  K 4.2  --  3.8 3.9 3.8  CL 93*  --  98 98 99  CO2 16*  --  20* 19* 19*  GLUCOSE 377*  --  254* 231* 182*  BUN 20  --  16 17 17   CREATININE 1.04 1.02 0.90 0.87 0.90  CALCIUM 7.4*  --  7.5* 7.6* 7.6*     Liver Function Tests: Recent Labs  Lab 01/17/20 1515 01/18/20 0500 01/19/20 1004  AST 24 20 34  ALT 14 14 16   ALKPHOS 99 77 82  BILITOT 3.4* 3.0* 2.2*  PROT 5.6* 5.5* 5.7*  ALBUMIN 2.0* 1.9* 1.9*     Antibiotics: Anti-infectives (From admission, onward)   Start     Dose/Rate Route Frequency Ordered Stop   01/18/20 2200  vancomycin (VANCOREADY) IVPB 1250 mg/250 mL        1,250 mg 166.7 mL/hr over 90 Minutes Intravenous Every 12 hours 01/18/20 0846     01/18/20 1600   ceFEPIme (MAXIPIME) 2 g in sodium chloride 0.9 % 100 mL IVPB        2 g 200 mL/hr over 30 Minutes Intravenous Every 8 hours 01/18/20 0842     01/18/20 0630  metroNIDAZOLE (FLAGYL) IVPB 500 mg        500 mg 100 mL/hr over 60 Minutes Intravenous Every 8 hours 01/18/20 0613     01/18/20 0000  ceFEPIme (MAXIPIME) 1 g in sodium chloride 0.9 % 100 mL IVPB  Status:  Discontinued        1 g 200 mL/hr over 30 Minutes Intravenous Every 8 hours 01/17/20 1735 01/18/20 0842   01/18/20 0000  vancomycin (VANCOREADY) IVPB 1250 mg/250 mL  Status:  Discontinued        1,250 mg 166.7 mL/hr over 90 Minutes Intravenous Every 8 hours 01/17/20 1735 01/18/20 0846   01/17/20 1530  vancomycin (VANCOREADY) IVPB 2000 mg/400 mL        2,000 mg 200 mL/hr over 120 Minutes Intravenous  Once 01/17/20 1444 01/17/20 1724   01/17/20 1445  ceFEPIme (MAXIPIME) 2 g in sodium chloride 0.9 % 100 mL IVPB        2 g 200 mL/hr over 30 Minutes Intravenous  Once 01/17/20 1442 01/17/20 1513   01/17/20 1445  metroNIDAZOLE (FLAGYL) IVPB 500 mg        500 mg 100 mL/hr over 60 Minutes Intravenous  Once 01/17/20 1442 01/17/20 1623   01/17/20 1445  vancomycin (VANCOCIN) IVPB 1000 mg/200 mL premix  Status:  Discontinued        1,000 mg 200 mL/hr over 60 Minutes Intravenous  Once 01/17/20 1442 01/17/20 1444       DVT prophylaxis: Heparin  Code Status: Full code  Family Communication: No family at bedside    Status is: Inpatient  Dispo: The patient is from: Home              Anticipated d/c is to: Home versus skilled nursing facility              Anticipated d/c date is: 01/22/2020              Patient currently not medically stable for discharge  Barrier to discharge-ongoing IV antibiotics for right lower extremity cellulitis     Consultants:  None  Procedures:  None   Objective   Vitals:   01/18/20 2328 01/19/20 0512 01/19/20 0808 01/19/20 1257  BP: (!) 148/59 (!) 141/59 (!) 127/53 (!) 148/54  Pulse: 84 77 66  75  Resp: (!) 28 18 20 20   Temp: 98.4 F (36.9 C) 98.4  F (36.9 C) 98.6 F (37 C) 98.9 F (37.2 C)  TempSrc: Oral Oral Oral Oral  SpO2: 100%  97% 98%  Weight:      Height:        Intake/Output Summary (Last 24 hours) at 01/19/2020 1529 Last data filed at 01/19/2020 0751 Gross per 24 hour  Intake 2323.71 ml  Output 665 ml  Net 1658.71 ml    09/13 1901 - 09/15 0700 In: 2770.7 [P.O.:240; I.V.:1232.2] Out: 440 [Urine:240]  Filed Weights   01/17/20 1427 01/18/20 2028  Weight: 132 kg 127.1 kg    Physical Examination:    General: Appears in no acute distress  Cardiovascular: S1-S2, regular, no murmur auscultated  Respiratory: Clear to auscultation bilaterally  Abdomen: Abdomen is soft, nontender, no organomegaly  Extremities: Erythema, tenderness to palpation in posterior thigh  Neurologic: Cranial nerves II through grossly intact, no focal deficit noted    Data Reviewed:   Recent Results (from the past 240 hour(s))  Blood Culture (routine x 2)     Status: None (Preliminary result)   Collection Time: 01/17/20  2:30 PM   Specimen: BLOOD  Result Value Ref Range Status   Specimen Description BLOOD LEFT ANTECUBITAL  Final   Special Requests   Final    BOTTLES DRAWN AEROBIC AND ANAEROBIC Blood Culture results may not be optimal due to an inadequate volume of blood received in culture bottles   Culture   Final    NO GROWTH 2 DAYS Performed at [facility], [address] [place], [place] [address]    Report Status PENDING  Incomplete  Blood Culture (routine x 2)     Status: None (Preliminary result)   Collection Time: 01/17/20  2:30 PM   Specimen: BLOOD  Result Value Ref Range Status   Specimen Description BLOOD RIGHT ANTECUBITAL  Final   Special Requests   Final    BOTTLES DRAWN AEROBIC AND ANAEROBIC Blood Culture adequate volume   Culture   Final    NO GROWTH 2 DAYS Performed at [facility], [address] [place], [place] [address]     Report Status PENDING  Incomplete  SARS Coronavirus 2 by RT PCR (hospital order, performed in [facility] hospital lab) Nasopharyngeal Nasopharyngeal Swab     Status: None   Collection Time: 01/17/20  3:17 PM   Specimen: Nasopharyngeal Swab  Result Value Ref Range Status   SARS Coronavirus 2 NEGATIVE NEGATIVE Final    Comment: (NOTE) SARS-CoV-2 target nucleic acids are NOT DETECTED.  The SARS-CoV-2 RNA is generally detectable in upper and lower respiratory specimens during the acute phase of infection. The lowest concentration of SARS-CoV-2 viral copies this assay can detect is 250 copies / mL. A negative result does not preclude SARS-CoV-2 infection and should not be used as the sole basis for treatment or other patient management decisions.  A negative result may occur with improper specimen collection / handling, submission of specimen other than nasopharyngeal swab, presence of viral mutation(s) within the areas targeted by this assay, and inadequate number of viral copies (<250 copies / mL). A negative result must be combined with clinical observations, patient history, and epidemiological information.  Fact Sheet for Patients:   BoilerBrush.com.cy  Fact Sheet for Healthcare Providers: https://pope.com/  This test is not yet approved or  cleared by the [place] FDA and has been authorized for detection and/or diagnosis of SARS-CoV-2 by FDA under an Emergency Use Authorization (EUA).  This EUA will remain  in effect (meaning this test can be used) for the duration of the COVID-19 declaration under Section 564(b)(1) of the Act, 21 U.S.C. section 360bbb-3(b)(1), unless the authorization is terminated or revoked sooner.  Performed at [facility], [address], [place], [place] [address]   Urine culture     Status: Abnormal   Collection Time: 01/17/20  4:16 PM   Specimen: In/Out Cath Urine  Result Value Ref Range  Status   Specimen Description IN/OUT CATH URINE  Final   Special Requests NONE  Final   Culture (A)  Final    30,000 COLONIES/mL GROUP B STREP(S.AGALACTIAE)ISOLATED TESTING AGAINST S. AGALACTIAE NOT ROUTINELY PERFORMED DUE TO PREDICTABILITY OF AMP/PEN/VAN SUSCEPTIBILITY. Performed at [facility], [address], [place], [place] [address]    Report Status 01/18/2020 FINAL  Final    No results for input(s): LIPASE, AMYLASE in the last 168 hours. No results for input(s): AMMONIA in the last 168 hours.  Cardiac Enzymes: No results for input(s): CKTOTAL, CKMB, CKMBINDEX, TROPONINI in the last 168 hours. BNP (last 3 results) Recent Labs    01/18/20 1212  BNP 648.6*    ProBNP (last 3 results) No results for input(s): PROBNP in the last 8760 hours.  Studies:  CT FEMUR RIGHT W CONTRAST  Result Date: 01/17/2020 CLINICAL DATA:  Pain and swelling in the right leg for several weeks EXAM: CT OF THE LOWER RIGHT EXTREMITY WITH CONTRAST TECHNIQUE: Multidetector CT imaging of the lower right extremity was performed according to the standard protocol following intravenous contrast administration. COMPARISON:  None. CONTRAST:  OMNIPAQUE IOHEXOL 300 MG/ML  SOLN FINDINGS: Bones/Joint/Cartilage No fracture or dislocation. Mild right hip osteoarthritis is seen with joint space loss and marginal osteophyte formation. Medial compartment osteoarthritis seen at the knee with joint space loss. No areas of cortical destruction or periosteal reaction are noted. No large knee, ankle, or hip joint effusion is seen. Ligaments Suboptimally assessed by CT. Muscles and Tendons Mild fatty atrophy seen within the muscles surrounding the femur and lower extremity. No focal atrophy or tear however is seen. The visualized portion the tendons intact. Soft tissues There is diffuse subcutaneous edema and skin thickening seen the anterolateral of the femur and extremity. There tortuous superficial varicosities noted.  Edema is seen also within the popliteal fossa. No loculated fluid collection or subcutaneous emphysema is seen. IMPRESSION: Diffuse subcutaneous edema and skin thickening seen predominantly along the anterolateral aspect of the lower extremity which is likely due to diffuse cellulitis. No loculated fluid collections or evidence of osteomyelitis. Edema within the popliteal fossa. Electronically Signed   By: Jonna Clark M.D.   On: 01/17/2020 18:28   CT TIBIA FIBULA RIGHT W CONTRAST  Result Date: 01/17/2020 CLINICAL DATA:  Pain and swelling in the right leg for several weeks EXAM: CT OF THE LOWER RIGHT EXTREMITY WITH CONTRAST TECHNIQUE: Multidetector CT imaging of the lower right extremity was performed according to the standard protocol following intravenous contrast administration. COMPARISON:  None. CONTRAST:  OMNIPAQUE IOHEXOL 300 MG/ML  SOLN FINDINGS: Bones/Joint/Cartilage No fracture or dislocation. Mild right hip osteoarthritis is seen with joint space loss and marginal osteophyte formation. Medial compartment osteoarthritis seen at the knee with joint space loss. No areas of cortical destruction or periosteal reaction are noted. No large knee, ankle, or hip joint effusion is seen. Ligaments Suboptimally assessed by CT. Muscles and Tendons Mild fatty atrophy seen within the muscles surrounding the femur and lower extremity. No focal atrophy or  tear however is seen. The visualized portion the tendons intact. Soft tissues There is diffuse subcutaneous edema and skin thickening seen the anterolateral of the femur and extremity. There tortuous superficial varicosities noted. Edema is seen also within the popliteal fossa. No loculated fluid collection or subcutaneous emphysema is seen. IMPRESSION: Diffuse subcutaneous edema and skin thickening seen predominantly along the anterolateral aspect of the lower extremity which is likely due to diffuse cellulitis. No loculated fluid collections or evidence of  osteomyelitis. Edema within the popliteal fossa. Electronically Signed   By: Jonna Clark M.D.   On: 01/17/2020 18:28   US Abdomen Limited  Result Date: 01/18/2020 CLINICAL DATA:  Diabetes mellitus, hypertension, elevated bilirubin, question cirrhosis EXAM: ULTRASOUND ABDOMEN LIMITED RIGHT UPPER QUADRANT COMPARISON:  None FINDINGS: Gallbladder: Surgically absent Common bile duct: Diameter: 6 mm, normal post cholecystectomy Liver: Coarsened echogenicity of the liver with nodular hepatic margins consistent with cirrhosis. No discrete hepatic mass identified. Portal vein is patent on color Doppler imaging with normal direction of blood flow towards the liver. Other: Minimal perihepatic ascites.  Small RIGHT pleural effusion. IMPRESSION: Cirrhotic appearing liver without definite mass. Post cholecystectomy. Minimal ascites and small RIGHT pleural effusion. Electronically Signed   By: Ulyses Southward M.D.   On: 01/18/2020 13:19   ECHOCARDIOGRAM COMPLETE  Result Date: 01/18/2020    ECHOCARDIOGRAM REPORT   Patient Name:   Dennis Green Date of Exam: 01/18/2020 Medical Rec #:  [id number]      Height:       69.0 in Accession #:    [id number]     Weight:       291.0 lb Date of Birth:  11-07-1965      BSA:          2.423 m Patient Age:    54 years       BP:           161/64 mmHg Patient Gender: M              HR:           82 bpm. Exam Location:  Inpatient Procedure: 2D Echo, Cardiac Doppler and Color Doppler Indications:    R60.0 Lower extremity edema  History:        Patient has no prior history of Echocardiogram examinations.                 Risk Factors:Hypertension and Diabetes.  Sonographer:    Elmarie Shiley Dance Referring Phys: Nilda Calamity AKULA IMPRESSIONS  1. Left ventricular ejection fraction, by estimation, is 60 to 65%. The left ventricle has normal function. The left ventricle has no regional wall motion abnormalities. Left ventricular diastolic parameters are indeterminate.  2. Right ventricular systolic function  is normal. The right ventricular size is normal. Tricuspid regurgitation signal is inadequate for assessing PA pressure.  3. Left atrial size was severely dilated.  4. The mitral valve is normal in structure. No evidence of mitral valve regurgitation. No evidence of mitral stenosis.  5. The aortic valve is tricuspid. There is moderate calcification of the aortic valve. There is moderate thickening of the aortic valve. Aortic valve regurgitation is not visualized. No aortic stenosis is present.  6. Aortic dilatation noted. There is mild dilatation of the aortic root, measuring 40 mm.  7. The inferior vena cava is dilated in size with >50% respiratory variability, suggesting right atrial pressure of 8 mmHg. FINDINGS  Left Ventricle: Left ventricular ejection fraction, by estimation, is 60 to 65%. The left  ventricle has normal function. The left ventricle has no regional wall motion abnormalities. The left ventricular internal cavity size was normal in size. There is  no left ventricular hypertrophy. Left ventricular diastolic parameters are indeterminate. Right Ventricle: The right ventricular size is normal. No increase in right ventricular wall thickness. Right ventricular systolic function is normal. Tricuspid regurgitation signal is inadequate for assessing PA pressure. Left Atrium: Left atrial size was severely dilated. Right Atrium: Right atrial size was normal in size. Pericardium: There is no evidence of pericardial effusion. Mitral Valve: The mitral valve is normal in structure. No evidence of mitral valve regurgitation. No evidence of mitral valve stenosis. Tricuspid Valve: The tricuspid valve is normal in structure. Tricuspid valve regurgitation is not demonstrated. No evidence of tricuspid stenosis. Aortic Valve: The aortic valve is tricuspid. There is moderate calcification of the aortic valve. There is moderate thickening of the aortic valve. Aortic valve regurgitation is not visualized. No aortic  stenosis is present. Pulmonic Valve: The pulmonic valve was normal in structure. Pulmonic valve regurgitation is trivial. No evidence of pulmonic stenosis. Aorta: Aortic dilatation noted. There is mild dilatation of the aortic root, measuring 40 mm. Venous: The inferior vena cava is dilated in size with greater than 50% respiratory variability, suggesting right atrial pressure of 8 mmHg. IAS/Shunts: No atrial level shunt detected by color flow Doppler.  LEFT VENTRICLE PLAX 2D LVIDd:         5.40 cm LVIDs:         3.50 cm LV PW:         1.70 cm LV IVS:        1.00 cm LVOT diam:     1.90 cm LV SV:         66 LV SV Index:   27 LVOT Area:     2.84 cm  RIGHT VENTRICLE          IVC RV Basal diam:  3.60 cm  IVC diam: 2.40 cm RV Mid diam:    3.00 cm TAPSE (M-mode): 2.6 cm LEFT ATRIUM              Index       RIGHT ATRIUM           Index LA diam:        5.20 cm  2.15 cm/m  RA Area:     21.70 cm LA Vol (A2C):   101.0 ml 41.68 ml/m RA Volume:   67.90 ml  28.02 ml/m LA Vol (A4C):   143.0 ml 59.02 ml/m LA Biplane Vol: 125.0 ml 51.59 ml/m  AORTIC VALVE LVOT Vmax:   118.00 cm/s LVOT Vmean:  84.400 cm/s LVOT VTI:    0.234 m  AORTA Ao Root diam: 4.00 cm Ao Asc diam:  3.50 cm MITRAL VALVE MV Area (PHT): 3.17 cm     SHUNTS MV Decel Time: 239 msec     Systemic VTI:  0.23 m MV E velocity: 124.00 cm/s  Systemic Diam: 1.90 cm MV A velocity: 70.90 cm/s MV E/A ratio:  1.75 Armanda Magicraci Turner MD Electronically signed by Armanda Magicraci Turner MD Signature Date/Time: 01/18/2020/5:08:21 PM    Final    VAS US LOWER EXTREMITY VENOUS (DVT) (ONLY [facility] & [facility] 7a-7p)  Result Date: 01/18/2020  Lower Venous DVTStudy Indications: Edema.  Limitations: Body habitus and poor ultrasound/tissue interface. Comparison Study: no prior Performing Technologist: Blanch MediaMegan Riddle RVS  Examination Guidelines: A complete evaluation includes B-mode imaging, spectral Doppler, color Doppler, and power Doppler as needed  of all accessible portions of each vessel. Bilateral testing is  considered an integral part of a complete examination. Limited examinations for reoccurring indications may be performed as noted. The reflux portion of the exam is performed with the patient in reverse Trendelenburg.  +---------+---------------+---------+-----------+----------+--------------+ RIGHT    CompressibilityPhasicitySpontaneityPropertiesThrombus Aging +---------+---------------+---------+-----------+----------+--------------+ CFV      Full           Yes      Yes                                 +---------+---------------+---------+-----------+----------+--------------+ SFJ      Full                                                        +---------+---------------+---------+-----------+----------+--------------+ FV Prox  Full                                                        +---------+---------------+---------+-----------+----------+--------------+ FV Mid                  Yes      Yes                                 +---------+---------------+---------+-----------+----------+--------------+ FV Distal               Yes      Yes                                 +---------+---------------+---------+-----------+----------+--------------+ PFV      Full                                                        +---------+---------------+---------+-----------+----------+--------------+ POP      Full           Yes      Yes                                 +---------+---------------+---------+-----------+----------+--------------+ PTV      Full                                                        +---------+---------------+---------+-----------+----------+--------------+ PERO                                                  Not visualized +---------+---------------+---------+-----------+----------+--------------+   +----+---------------+---------+-----------+----------+--------------+  LEFTCompressibilityPhasicitySpontaneityPropertiesThrombus Aging +----+---------------+---------+-----------+----------+--------------+ CFV Full  Yes      Yes                                 +----+---------------+---------+-----------+----------+--------------+     Summary: RIGHT: - There is no evidence of deep vein thrombosis in the lower extremity. However, portions of this examination were limited- see technologist comments above.  - No cystic structure found in the popliteal fossa.  LEFT: - No evidence of common femoral vein obstruction.  *See table(s) above for measurements and observations. Electronically signed by Gretta Began MD on 01/18/2020 at 5:05:12 PM.    Final        Meredeth Ide   [facility] If 7PM-7AM, please contact night-coverage at [email or web address], Office  [phone]   01/19/2020, 3:29 PM  LOS: 2 days

## 2020-01-19 NOTE — Plan of Care (Signed)
°  Problem: Clinical Measurements: °Goal: Ability to maintain clinical measurements within normal limits will improve °Outcome: Progressing °Goal: Respiratory complications will improve °Outcome: Progressing °Goal: Cardiovascular complication will be avoided °Outcome: Progressing °  °Problem: Nutrition: °Goal: Adequate nutrition will be maintained °Outcome: Progressing °  °Problem: Coping: °Goal: Level of anxiety will decrease °Outcome: Progressing °  °Problem: Elimination: °Goal: Will not experience complications related to bowel motility °Outcome: Progressing °Goal: Will not experience complications related to urinary retention °Outcome: Progressing °  °

## 2020-01-19 NOTE — Evaluation (Signed)
Physical Therapy Evaluation Patient Details Name: Dennis Green MRN: [id number] DOB: 1965-09-26 Today's Date: 01/19/2020   History of Present Illness  54 year old gentleman with prior history of hypertension, type 2 diabetes mellitus reports worsening pain in the right lower extremity over the past 2 weeks also reports bilateral leg edema for over a month.  On arrival to ED he was found to be febrile, elevated lactic acid,  CT of the right leg shows features consistent with diffuse cellulitis of the leg.  Blood cultures were drawn and sepsis protocol was initiated.  Venous duplex was ordered and is negative for acute DVT.  Clinical Impression  Pt admitted with above diagnosis. Pt ambulated 6' with RW, distance limited by RLE pain. At baseline he ambulates with a cane. Pt stated he is homeless and lives outside.  Pt currently with functional limitations due to the deficits listed below (see PT Problem List). Pt will benefit from skilled PT to increase their independence and safety with mobility to allow discharge to the venue listed below.       Follow Up Recommendations SNF (vs. No follow up, depending on progress);Supervision for mobility/OOB    Equipment Recommendations  Rolling walker with 5" wheels    Recommendations for Other Services       Precautions / Restrictions Precautions Precautions: Fall Precaution Comments: pt denies falls in past 1 year Restrictions Weight Bearing Restrictions: No      Mobility  Bed Mobility Overal bed mobility: Modified Independent             General bed mobility comments: HOB up, used rail  Transfers Overall transfer level: Needs assistance Equipment used: Rolling walker (2 wheeled);Straight cane Transfers: Sit to/from [employer] Sit to Stand: Min assist;From elevated surface Stand pivot transfers: Min assist       General transfer comment: VCs hand placement, min A to power up and to control descent. Sit to stand x  2 once with SPC, then once with RW  Ambulation/Gait Ambulation/Gait assistance: Min guard Gait Distance (Feet): 6 Feet Assistive device: Rolling walker (2 wheeled) Gait Pattern/deviations: Step-to pattern;Decreased weight shift to right;Antalgic Gait velocity: decr   General Gait Details: pt used SPC for taking a few pivotal steps to 3 in 1, pain with RLE WB limited tolerance. Trial of RW to ambulate 6', pt reported less pain. Distance limited by pain  Stairs            Wheelchair Mobility    Modified Rankin (Stroke Patients Only)       Balance Overall balance assessment: Needs assistance   Sitting balance-Leahy Scale: Good       Standing balance-Leahy Scale: Fair                               Pertinent Vitals/Pain Pain Assessment: 0-10 Pain Score: 8  Pain Location: R calf, esp with weightbearing Pain Descriptors / Indicators: Guarding;Grimacing Pain Intervention(s): Limited activity within patient's tolerance;Monitored during session;Premedicated before session;Repositioned    Home Living Family/patient expects to be discharged to:: Shelter/Homeless                 Additional Comments: pt stated he is homeless and lives outside    Prior Function Level of Independence: Independent         Comments: walked with [facility], denies falls in past 1 year     Hand Dominance        Extremity/Trunk Assessment  Upper Extremity Assessment Upper Extremity Assessment: Overall WFL for tasks assessed    Lower Extremity Assessment Lower Extremity Assessment: RLE deficits/detail RLE Deficits / Details: pitting edema in ankle/calf, knee ext AROM -15* limited by pain, knee ext +3/5 RLE: Unable to fully assess due to pain RLE Sensation: WNL RLE Coordination: WNL    Cervical / Trunk Assessment Cervical / Trunk Assessment: Normal  Communication   Communication: No difficulties  Cognition Arousal/Alertness: Awake/alert Behavior During  Therapy: WFL for tasks assessed/performed Overall Cognitive Status: Within Functional Limits for tasks assessed                                        General Comments      Exercises General Exercises - Lower Extremity Ankle Circles/Pumps: AROM;Both;10 reps;Seated   Assessment/Plan    PT Assessment Patient needs continued PT services  PT Problem List Decreased range of motion;Decreased activity tolerance;Decreased strength;Decreased mobility;Pain;Decreased balance;Decreased knowledge of use of DME       PT Treatment Interventions Gait training;Therapeutic activities;Functional mobility training;Balance training;Therapeutic exercise;Patient/family education    PT Goals (Current goals can be found in the Care Plan section)  Acute Rehab PT Goals Patient Stated Goal: reduce pain, be able to walk PT Goal Formulation: With patient Time For Goal Achievement: 02/02/20 Potential to Achieve Goals: Good    Frequency Min 3X/week   Barriers to discharge        Co-evaluation               AM-PAC PT "6 Clicks" Mobility  Outcome Measure Help needed turning from your back to your side while in a flat bed without using bedrails?: None Help needed moving from lying on your back to sitting on the side of a flat bed without using bedrails?: A Little Help needed moving to and from a bed to a chair (including a wheelchair)?: A Little Help needed standing up from a chair using your arms (e.g., wheelchair or bedside chair)?: A Little Help needed to walk in hospital room?: A Little Help needed climbing 3-5 steps with a railing? : A Lot 6 Click Score: 18    End of Session Equipment Utilized During Treatment: Gait belt Activity Tolerance: Patient limited by pain Patient left: in chair;with call bell/phone within reach;with chair alarm set Nurse Communication: Mobility status PT Visit Diagnosis: Difficulty in walking, not elsewhere classified (R26.2);Pain Pain -  Right/Left: Right Pain - part of body: Leg    Time: 5003-7048 PT Time Calculation (min) (ACUTE ONLY): 27 min   Charges:   PT Evaluation $PT Eval Low Complexity: 1 Low PT Treatments $Therapeutic Activity: 8-22 mins       Ralene Bathe Kistler PT 01/19/2020  Acute Rehabilitation Services Pager [phone] Office [phone]

## 2020-01-19 NOTE — Progress Notes (Signed)
This patient has barriers to SNF placement due to lack of insurance and homeless status.CSW has reached out to Financial Counseling for this patient and requested to be screened for Medicaid and disability.  CSW will continue to follow.

## 2020-01-20 LAB — HEPATITIS C ANTIBODY: HCV Ab: NONREACTIVE

## 2020-01-20 LAB — GLUCOSE, CAPILLARY
Glucose-Capillary: 117 mg/dL — ABNORMAL HIGH (ref 70–99)
Glucose-Capillary: 127 mg/dL — ABNORMAL HIGH (ref 70–99)
Glucose-Capillary: 154 mg/dL — ABNORMAL HIGH (ref 70–99)
Glucose-Capillary: 167 mg/dL — ABNORMAL HIGH (ref 70–99)
Glucose-Capillary: 213 mg/dL — ABNORMAL HIGH (ref 70–99)

## 2020-01-20 LAB — HEPATITIS B SURFACE ANTIGEN: Hepatitis B Surface Ag: NONREACTIVE

## 2020-01-20 LAB — HEPATITIS A ANTIBODY, TOTAL: hep A Total Ab: NONREACTIVE

## 2020-01-20 LAB — HEPATITIS B SURFACE ANTIBODY,QUALITATIVE: Hep B S Ab: NONREACTIVE

## 2020-01-20 MED ORDER — TRAMADOL HCL 50 MG PO TABS
50.0000 mg | ORAL_TABLET | Freq: Four times a day (QID) | ORAL | Status: DC | PRN
  Administered 2020-01-20 – 2020-01-25 (×12): 50 mg via ORAL
  Filled 2020-01-20 (×12): qty 1

## 2020-01-20 MED ORDER — INFLUENZA VAC SPLIT QUAD 0.5 ML IM SUSY
0.5000 mL | PREFILLED_SYRINGE | INTRAMUSCULAR | Status: AC
  Administered 2020-01-26: 0.5 mL via INTRAMUSCULAR
  Filled 2020-01-20: qty 0.5

## 2020-01-20 MED ORDER — OXYCODONE HCL 5 MG PO TABS: 5.0000 mg | ORAL_TABLET | Freq: Four times a day (QID) | ORAL | Status: DC | PRN

## 2020-01-20 NOTE — Progress Notes (Signed)
[facility]  PROGRESS NOTE  Dennis CohoJames Green WUJ:[id number] DOB: [birth year] DOA: 01/17/2020 PCP: Patient, No Pcp Per   Brief HPI:   54 year old male with history of hypertension, diabetes mellitus type 2, presented with worsening pain right lower extremity for past 2 weeks.  Also reports bilateral leg edema for a month.  Denied chest pain or shortness of breath.  In the ED he was found to have elevated lactic acid.  CT of the right leg showed features consistent with diffuse cellulitis of the leg.  Blood cultures were drawn and sepsis protocol was initiated.  Venous duplex of lower extremities was negative for acute DVT.    Subjective   Patient seen and examined, continues to have mild pain and redness in right posterior thigh.  GI was consulted and an appointment has been set up as outpatient for work-up for liver cirrhosis.   Assessment/Plan:     1. Sepsis-secondary to cellulitis of right posterior thigh, sepsis physiology has resolved.  Continue IV antibiotics including vancomycin, Flagyl, cefepime.  Venous duplex of lower extremities negative for DVT. 2. Hypertension-blood pressure is now elevated, restart Cozaar 100 mg p.o. daily. 3. Diabetes mellitus type 2-hemoglobin A1c was 8.8.  Continue Lantus 35 units subcu daily.  Resistant sliding scale.  NovoLog 5 units 3 times daily with meals.  Blood glucose is fairly well controlled. 4. Anemia of chronic disease/normocytic anemia-hemoglobin stable at 11. 5. Liver cirrhosis /thrombocytopenia-platelet count 61,000 today.  Abdominal ultrasound shows cirrhotic liver.  Patient denies heavy alcohol use in the past.  No previous history of hepatitis.  Will check hepatitis panel.  Will need GI evaluation as outpatient.  No signs of hepatic decompensation at this time.  LFTs are normal.patient has elevated thrombin time, hypoalbuminemia, thrombocytopenia, abdominal ultrasound shows cirrhotic liver.  Unclear etiology, called [facility], appointment has  been set up as outpatient.  Work-up for liver cirrhosis has been started.  Labs drawn in the hospital. 6. Hyponatremia-sodium 129, likely from underlying liver cirrhosis.   COVID-19 Labs  Recent Labs    01/18/20 1212  DDIMER 2.18*  FERRITIN 257  CRP 16.7*    Lab Results  Component Value Date   SARSCOV2NAA NEGATIVE 01/17/2020     Scheduled medications:   . insulin aspart  0-20 Units Subcutaneous TID WC  . insulin aspart  0-5 Units Subcutaneous QHS  . insulin aspart  5 Units Subcutaneous TID WC  . insulin glargine  25 Units Subcutaneous QHS  . losartan  50 mg Oral Daily  . pneumococcal 23 valent vaccine  0.5 mL Intramuscular Tomorrow-1000    CBG: Recent Labs  Lab 01/19/20 1157 01/19/20 1626 01/19/20 2046 01/20/20 0019 01/20/20 0745  GLUCAP 233* 209* 160* 167* 117*    SpO2: 96 %    CBC: Recent Labs  Lab 01/17/20 1515 01/17/20 1515 01/17/20 2209 01/18/20 0500 01/18/20 0912 01/18/20 2043 01/19/20 1004  WBC 7.7  --  6.5 6.6  --   --  8.5  NEUTROABS 6.5  --   --  5.4  --   --  7.0  HGB 11.1*  --  11.0* 10.9*  --   --  12.3*  HCT 33.3*  --  32.8* 32.6*  --   --  36.5*  MCV 90.7  --  91.1 91.1  --   --  89.9  PLT PLATELET CLUMPS NOTED ON SMEAR, UNABLE TO ESTIMATE   < > PLATELET CLUMPS NOTED ON SMEAR, UNABLE TO ESTIMATE 58* 55* 76* 61*   < > =  values in this interval not displayed.    Basic Metabolic Panel: Recent Labs  Lab 01/17/20 1515 01/17/20 1515 01/17/20 2209 01/18/20 0500 01/18/20 2043 01/19/20 1004 01/19/20 1742  NA 123*  --   --  127* 127* 128* 129*  K 4.2  --   --  3.8 3.9 3.8 4.2  CL 93*  --   --  98 98 99 101  CO2 16*  --   --  20* 19* 19* 21*  GLUCOSE 377*  --   --  254* 231* 182* 202*  BUN 20  --   --  16 17 17 19   CREATININE 1.04   < > 1.02 0.90 0.87 0.90 0.80  CALCIUM 7.4*  --   --  7.5* 7.6* 7.6* 7.5*   < > = values in this interval not displayed.     Liver Function Tests: Recent Labs  Lab 01/17/20 1515 01/18/20 0500  01/19/20 1004  AST 24 20 34  ALT 14 14 16   ALKPHOS 99 77 82  BILITOT 3.4* 3.0* 2.2*  PROT 5.6* 5.5* 5.7*  ALBUMIN 2.0* 1.9* 1.9*     Antibiotics: Anti-infectives (From admission, onward)   Start     Dose/Rate Route Frequency Ordered Stop   01/18/20 2200  vancomycin (VANCOREADY) IVPB 1250 mg/250 mL        1,250 mg 166.7 mL/hr over 90 Minutes Intravenous Every 12 hours 01/18/20 0846     01/18/20 1600  ceFEPIme (MAXIPIME) 2 g in sodium chloride 0.9 % 100 mL IVPB        2 g 200 mL/hr over 30 Minutes Intravenous Every 8 hours 01/18/20 0842     01/18/20 0630  metroNIDAZOLE (FLAGYL) IVPB 500 mg        500 mg 100 mL/hr over 60 Minutes Intravenous Every 8 hours 01/18/20 0613     01/18/20 0000  ceFEPIme (MAXIPIME) 1 g in sodium chloride 0.9 % 100 mL IVPB  Status:  Discontinued        1 g 200 mL/hr over 30 Minutes Intravenous Every 8 hours 01/17/20 1735 01/18/20 0842   01/18/20 0000  vancomycin (VANCOREADY) IVPB 1250 mg/250 mL  Status:  Discontinued        1,250 mg 166.7 mL/hr over 90 Minutes Intravenous Every 8 hours 01/17/20 1735 01/18/20 0846   01/17/20 1530  vancomycin (VANCOREADY) IVPB 2000 mg/400 mL        2,000 mg 200 mL/hr over 120 Minutes Intravenous  Once 01/17/20 1444 01/17/20 1724   01/17/20 1445  ceFEPIme (MAXIPIME) 2 g in sodium chloride 0.9 % 100 mL IVPB        2 g 200 mL/hr over 30 Minutes Intravenous  Once 01/17/20 1442 01/17/20 1513   01/17/20 1445  metroNIDAZOLE (FLAGYL) IVPB 500 mg        500 mg 100 mL/hr over 60 Minutes Intravenous  Once 01/17/20 1442 01/17/20 1623   01/17/20 1445  vancomycin (VANCOCIN) IVPB 1000 mg/200 mL premix  Status:  Discontinued        1,000 mg 200 mL/hr over 60 Minutes Intravenous  Once 01/17/20 1442 01/17/20 1444       DVT prophylaxis: Heparin  Code Status: Full code  Family Communication: No family at bedside    Status is: Inpatient  Dispo: The patient is from: Home              Anticipated d/c is to: Home versus skilled  nursing facility  Anticipated d/c date is: 01/22/2020              Patient currently not medically stable for discharge  Barrier to discharge-ongoing IV antibiotics for right lower extremity cellulitis     Consultants:  None  Procedures:  None   Objective   Vitals:   01/20/20 0055 01/20/20 0440 01/20/20 0629 01/20/20 0747  BP:   (!) 153/55 (!) 137/51  Pulse:   69 71  Resp:   18 20  Temp:   98.9 F (37.2 C) 99.4 F (37.4 C)  TempSrc:   Oral Oral  SpO2: 95% 96% 95% 96%  Weight:   129.7 kg   Height:        Intake/Output Summary (Last 24 hours) at 01/20/2020 1131 Last data filed at 01/20/2020 8185 Gross per 24 hour  Intake 1233.33 ml  Output 1425 ml  Net -191.67 ml    09/14 1901 - 09/16 0700 In: 3197 [P.O.:360; I.V.:1532.2] Out: 1675 [Urine:1475]  Filed Weights   01/17/20 1427 01/18/20 2028 01/20/20 0629  Weight: 132 kg 127.1 kg 129.7 kg    Physical Examination:   General-appears in no acute distress  Heart-S1-S2, regular, no murmur auscultated  Lungs-clear to auscultation bilaterally, no wheezing or crackles auscultated  Abdomen-soft, nontender, no organomegaly  Extremities-erythema, induration, tenderness noted at right posterior thigh  Neuro-alert, oriented x3, no focal deficit noted    Data Reviewed:   Recent Results (from the past 240 hour(s))  Blood Culture (routine x 2)     Status: None (Preliminary result)   Collection Time: 01/17/20  2:30 PM   Specimen: BLOOD  Result Value Ref Range Status   Specimen Description BLOOD LEFT ANTECUBITAL  Final   Special Requests   Final    BOTTLES DRAWN AEROBIC AND ANAEROBIC Blood Culture results may not be optimal due to an inadequate volume of blood received in culture bottles   Culture   Final    NO GROWTH 2 DAYS Performed at [facility], [address] [place], [place] [address]    Report Status PENDING  Incomplete  Blood Culture (routine x 2)     Status: None (Preliminary  result)   Collection Time: 01/17/20  2:30 PM   Specimen: BLOOD  Result Value Ref Range Status   Specimen Description BLOOD RIGHT ANTECUBITAL  Final   Special Requests   Final    BOTTLES DRAWN AEROBIC AND ANAEROBIC Blood Culture adequate volume   Culture   Final    NO GROWTH 2 DAYS Performed at [facility], [address], [place], [place] [address]    Report Status PENDING  Incomplete  SARS Coronavirus 2 by RT PCR (hospital order, performed in [facility] hospital lab) Nasopharyngeal Nasopharyngeal Swab     Status: None   Collection Time: 01/17/20  3:17 PM   Specimen: Nasopharyngeal Swab  Result Value Ref Range Status   SARS Coronavirus 2 NEGATIVE NEGATIVE Final    Comment: (NOTE) SARS-CoV-2 target nucleic acids are NOT DETECTED.  The SARS-CoV-2 RNA is generally detectable in upper and lower respiratory specimens during the acute phase of infection. The lowest concentration of SARS-CoV-2 viral copies this assay can detect is 250 copies / mL. A negative result does not preclude SARS-CoV-2 infection and should not be used as the sole basis for treatment or other patient management decisions.  A negative result may occur with improper specimen collection / handling, submission of specimen other than nasopharyngeal swab, presence of viral mutation(s) within  the areas targeted by this assay, and inadequate number of viral copies (<250 copies / mL). A negative result must be combined with clinical observations, patient history, and epidemiological information.  Fact Sheet for Patients:   BoilerBrush.com.cy  Fact Sheet for Healthcare Providers: https://pope.com/  This test is not yet approved or  cleared by the [place] FDA and has been authorized for detection and/or diagnosis of SARS-CoV-2 by FDA under an Emergency Use Authorization (EUA).  This EUA will remain in effect (meaning this test can be used) for the duration of  the COVID-19 declaration under Section 564(b)(1) of the Act, 21 U.S.C. section 360bbb-3(b)(1), unless the authorization is terminated or revoked sooner.  Performed at [facility], [address], [place], [place] [address]   Urine culture     Status: Abnormal   Collection Time: 01/17/20  4:16 PM   Specimen: In/Out Cath Urine  Result Value Ref Range Status   Specimen Description IN/OUT CATH URINE  Final   Special Requests NONE  Final   Culture (A)  Final    30,000 COLONIES/mL GROUP B STREP(S.AGALACTIAE)ISOLATED TESTING AGAINST S. AGALACTIAE NOT ROUTINELY PERFORMED DUE TO PREDICTABILITY OF AMP/PEN/VAN SUSCEPTIBILITY. Performed at [facility], [address], [place], [place] [address]    Report Status 01/18/2020 FINAL  Final    No results for input(s): LIPASE, AMYLASE in the last 168 hours. No results for input(s): AMMONIA in the last 168 hours.  Cardiac Enzymes: No results for input(s): CKTOTAL, CKMB, CKMBINDEX, TROPONINI in the last 168 hours. BNP (last 3 results) Recent Labs    01/18/20 1212  BNP 648.6*     Studies:  US Abdomen Limited  Result Date: 01/18/2020 CLINICAL DATA:  Diabetes mellitus, hypertension, elevated bilirubin, question cirrhosis EXAM: ULTRASOUND ABDOMEN LIMITED RIGHT UPPER QUADRANT COMPARISON:  None FINDINGS: Gallbladder: Surgically absent Common bile duct: Diameter: 6 mm, normal post cholecystectomy Liver: Coarsened echogenicity of the liver with nodular hepatic margins consistent with cirrhosis. No discrete hepatic mass identified. Portal vein is patent on color Doppler imaging with normal direction of blood flow towards the liver. Other: Minimal perihepatic ascites.  Small RIGHT pleural effusion. IMPRESSION: Cirrhotic appearing liver without definite mass. Post cholecystectomy. Minimal ascites and small RIGHT pleural effusion. Electronically Signed   By: Ulyses Southward M.D.   On: 01/18/2020 13:19   ECHOCARDIOGRAM COMPLETE  Result Date:  01/18/2020    ECHOCARDIOGRAM REPORT   Patient Name:   Dennis Green Date of Exam: 01/18/2020 Medical Rec #:  [id number]      Height:       69.0 in Accession #:    [id number]     Weight:       291.0 lb Date of Birth:  02/25/1966      BSA:          2.423 m Patient Age:    54 years       BP:           161/64 mmHg Patient Gender: M              HR:           82 bpm. Exam Location:  Inpatient Procedure: 2D Echo, Cardiac Doppler and Color Doppler Indications:    R60.0 Lower extremity edema  History:        Patient has no prior history of Echocardiogram examinations.                 Risk Factors:Hypertension and Diabetes.  Sonographer:  Tiffany Dance Referring Phys: [id number] VIJAYA AKULA IMPRESSIONS  1. Left ventricular ejection fraction, by estimation, is 60 to 65%. The left ventricle has normal function. The left ventricle has no regional wall motion abnormalities. Left ventricular diastolic parameters are indeterminate.  2. Right ventricular systolic function is normal. The right ventricular size is normal. Tricuspid regurgitation signal is inadequate for assessing PA pressure.  3. Left atrial size was severely dilated.  4. The mitral valve is normal in structure. No evidence of mitral valve regurgitation. No evidence of mitral stenosis.  5. The aortic valve is tricuspid. There is moderate calcification of the aortic valve. There is moderate thickening of the aortic valve. Aortic valve regurgitation is not visualized. No aortic stenosis is present.  6. Aortic dilatation noted. There is mild dilatation of the aortic root, measuring 40 mm.  7. The inferior vena cava is dilated in size with >50% respiratory variability, suggesting right atrial pressure of 8 mmHg. FINDINGS  Left Ventricle: Left ventricular ejection fraction, by estimation, is 60 to 65%. The left ventricle has normal function. The left ventricle has no regional wall motion abnormalities. The left ventricular internal cavity size was normal in size. There is  no  left ventricular hypertrophy. Left ventricular diastolic parameters are indeterminate. Right Ventricle: The right ventricular size is normal. No increase in right ventricular wall thickness. Right ventricular systolic function is normal. Tricuspid regurgitation signal is inadequate for assessing PA pressure. Left Atrium: Left atrial size was severely dilated. Right Atrium: Right atrial size was normal in size. Pericardium: There is no evidence of pericardial effusion. Mitral Valve: The mitral valve is normal in structure. No evidence of mitral valve regurgitation. No evidence of mitral valve stenosis. Tricuspid Valve: The tricuspid valve is normal in structure. Tricuspid valve regurgitation is not demonstrated. No evidence of tricuspid stenosis. Aortic Valve: The aortic valve is tricuspid. There is moderate calcification of the aortic valve. There is moderate thickening of the aortic valve. Aortic valve regurgitation is not visualized. No aortic stenosis is present. Pulmonic Valve: The pulmonic valve was normal in structure. Pulmonic valve regurgitation is trivial. No evidence of pulmonic stenosis. Aorta: Aortic dilatation noted. There is mild dilatation of the aortic root, measuring 40 mm. Venous: The inferior vena cava is dilated in size with greater than 50% respiratory variability, suggesting right atrial pressure of 8 mmHg. IAS/Shunts: No atrial level shunt detected by color flow Doppler.  LEFT VENTRICLE PLAX 2D LVIDd:         5.40 cm LVIDs:         3.50 cm LV PW:         1.70 cm LV IVS:        1.00 cm LVOT diam:     1.90 cm LV SV:         66 LV SV Index:   27 LVOT Area:     2.84 cm  RIGHT VENTRICLE          IVC RV Basal diam:  3.60 cm  IVC diam: 2.40 cm RV Mid diam:    3.00 cm TAPSE (M-mode): 2.6 cm LEFT ATRIUM              Index       RIGHT ATRIUM           Index LA diam:        5.20 cm  2.15 cm/m  RA Area:     21.70 cm LA Vol (A2C):   101.0 ml 41.68 ml/m RA Volume:  67.90 ml  28.02 ml/m LA Vol (A4C):    143.0 ml 59.02 ml/m LA Biplane Vol: 125.0 ml 51.59 ml/m  AORTIC VALVE LVOT Vmax:   118.00 cm/s LVOT Vmean:  84.400 cm/s LVOT VTI:    0.234 m  AORTA Ao Root diam: 4.00 cm Ao Asc diam:  3.50 cm MITRAL VALVE MV Area (PHT): 3.17 cm     SHUNTS MV Decel Time: 239 msec     Systemic VTI:  0.23 m MV E velocity: 124.00 cm/s  Systemic Diam: 1.90 cm MV A velocity: 70.90 cm/s MV E/A ratio:  1.75 Armanda Magic MD Electronically signed by Armanda Magic MD Signature Date/Time: 01/18/2020/5:08:21 PM    Final        Meredeth Ide   [facility] If 7PM-7AM, please contact night-coverage at [email or web address], Office  [phone]   01/20/2020, 11:31 AM  LOS: 3 days

## 2020-01-20 NOTE — Progress Notes (Signed)
Report given to [place], Charity fundraiser. Patient is alert and oriented x4 sitting up in bed with call light in reach. IVF infusing. IV team to come to try and get a second line due to left arm IV leaking and causing swelling. ACHS. SR on tele.  IV Vanc, Flagyl and Cefepime in place. CM working on getting patient approved to go to SNF vs rehab.  Send out labs drawn this AM.

## 2020-01-20 NOTE — TOC Progression Note (Signed)
Transition of Care [facility]) - Progression Note    Patient Details  Name: Dennis Green MRN: [id number] Date of Birth: Oct 02, 1965  Transition [facility]) CM/SW Contact  Terrial Rhodes, LCSWA Phone Number: 01/20/2020, 4:09 PM  Clinical Narrative:     CSW provided bed offers to patient and patient has accepted bed offer with [facility]. CSW confirmed bed offer with [facility] and they will start insurance authorization for patient.  CSW will continue to follow.  Expected Discharge Plan: Skilled Nursing Facility Barriers to Discharge: Continued Medical Work up  Expected Discharge Plan and Services Expected Discharge Plan: Skilled Nursing Facility       Living arrangements for the past 2 months: Homeless Shelter                                       Social Determinants of Health (SDOH) Interventions    Readmission Risk Interventions No flowsheet data found.

## 2020-01-20 NOTE — [place] FL2 (Signed)
[place] MEDICAID FL2 LEVEL OF CARE SCREENING TOOL     IDENTIFICATION  Patient Name: Dennis Green Birthdate: 10/02/1965 Sex: male Admission Date (Current Location): 01/17/2020  [facility] and [place] Number:  Producer, television/film/video and Address:  [facility], [address] [place], [place] [address]      Provider Number: [id number]  Attending Physician Name and Address:  Meredeth Ide, MD  Relative Name and Phone Number:       Current Level of Care: Hospital Recommended Level of Care: Skilled Nursing Facility Prior Approval Number:    Date Approved/Denied:   PASRR Number: [id number]  Discharge Plan: SNF    Current Diagnoses: Patient Active Problem List   Diagnosis Date Noted  . Sepsis (HCC) 01/17/2020  . Cellulitis of right lower extremity 01/17/2020  . Normochromic normocytic anemia 01/17/2020  . Uncontrolled type 2 diabetes mellitus with hyperglycemia (HCC) 01/17/2020  . Essential hypertension 01/17/2020    Orientation RESPIRATION BLADDER Height & Weight     Self, Time, Situation, Place (WDL)  Normal Continent Weight: 285 lb 14.4 oz (129.7 kg) Height:  5\' 9"  (175.3 cm)  BEHAVIORAL SYMPTOMS/MOOD NEUROLOGICAL BOWEL NUTRITION STATUS      Continent Diet (See Discharge Summary)  AMBULATORY STATUS COMMUNICATION OF NEEDS Skin   Limited Assist Verbally Other (Comment) (Appropriate for )                       Personal Care Assistance Level of Assistance  Bathing, Feeding, Dressing Bathing Assistance: Independent Feeding assistance: Independent (able to feed self;Cardiac Carb Modified) Dressing Assistance: Independent     Functional Limitations Info  Sight, Hearing, Speech Sight Info: Adequate Hearing Info: Adequate Speech Info: Adequate    SPECIAL CARE FACTORS FREQUENCY  PT (By licensed PT), OT (By licensed OT)     PT Frequency: 5x min weekly OT Frequency: 5x min weekly             Contractures Contractures Info: Not present    Additional Factors Info  Code Status, Allergies, Insulin Sliding Scale Code Status Info: FULL Allergies Info: Codeine,Hydrochlorothiazide   Insulin Sliding Scale Info: insulin aspart (novoLOG) injection 0-20 Units 3 times daily with meals,insulin aspart (novoLOG) injection 0-5 Units daily at bedtime,insulin aspart (novoLOG) injection 5 Units 3 times daily with meals,insulin glargine (LANTUS) injection 25 Units daily at bedtime       Current Medications (01/20/2020):  This is the current hospital active medication list Current Facility-Administered Medications  Medication Dose Route Frequency Provider Last Rate Last Admin  . 0.9 %  sodium chloride infusion   Intravenous Continuous 01/22/2020, MD 75 mL/hr at 01/19/20 2319 New Bag at 01/19/20 2319  . acetaminophen (TYLENOL) tablet 650 mg  650 mg Oral Q6H PRN 2320, MD   650 mg at 01/19/20 2321   Or  . acetaminophen (TYLENOL) suppository 650 mg  650 mg Rectal Q6H PRN 2322, MD      . ceFEPIme (MAXIPIME) 2 g in sodium chloride 0.9 % 100 mL IVPB  2 g Intravenous Q8H Rumbarger, Rachel L, RPH 200 mL/hr at 01/20/20 0917 2 g at 01/20/20 0917  . hydrALAZINE (APRESOLINE) injection 10 mg  10 mg Intravenous Q4H PRN 01/22/20, MD      . insulin aspart (novoLOG) injection 0-20 Units  0-20 Units Subcutaneous TID WC 01-06-1981, MD   7 Units at 01/19/20 1715  . insulin aspart (novoLOG) injection 0-5 Units  0-5  Units Subcutaneous QHS Kathlen Mody, MD   2 Units at 01/18/20 2155  . insulin aspart (novoLOG) injection 5 Units  5 Units Subcutaneous TID WC Eduard Clos, MD   5 Units at 01/20/20 [phone]  . insulin glargine (LANTUS) injection 25 Units  25 Units Subcutaneous QHS Eduard Clos, MD   25 Units at 01/19/20 2054  . losartan (COZAAR) tablet 50 mg  50 mg Oral Daily Meredeth Ide, MD   50 mg at 01/20/20 0923  . metroNIDAZOLE (FLAGYL) IVPB 500 mg  500  mg Intravenous Q8H Eduard Clos, MD 100 mL/hr at 01/20/20 0550 500 mg at 01/20/20 0550  . ondansetron (ZOFRAN) tablet 4 mg  4 mg Oral Q6H PRN Eduard Clos, MD       Or  . ondansetron [facility]) injection 4 mg  4 mg Intravenous Q6H PRN Eduard Clos, MD   4 mg at 01/19/20 0046  . pneumococcal 23 valent vaccine (PNEUMOVAX-23) injection 0.5 mL  0.5 mL Intramuscular Tomorrow-1000 [place], Sarina Ill, MD      . vancomycin (VANCOREADY) IVPB 1250 mg/250 mL  1,250 mg Intravenous Q12H Rumbarger, Faye Ramsay, [facility]   Stopped at 01/20/20 0150     Discharge Medications: Please see discharge summary for a list of discharge medications.  Relevant Imaging Results:  Relevant Lab Results:   Additional Information [id number]  Terrial Rhodes, LCSWA

## 2020-01-20 NOTE — TOC Initial Note (Addendum)
Transition of Care [facility]) - Initial/Assessment Note    Patient Details  Name: Dennis Green MRN: [id number] Date of Birth: 23-May-1965  Transition of [facility]) CM/SW Contact:    Terrial Rhodes, LCSWA Phone Number: 01/20/2020, 11:07 AM  Clinical Narrative:                  CSW received consult for possible SNF placement at time of discharge. CSW spoke with patient at bedside regarding PT recommendation of SNF placement at time of discharge. Patient expressed understanding of PT recommendation and is agreeable to SNF placement at time of discharge. Patient gave CSW permission to fax out initial referral for review  Patient has received the COVID vaccines. Patient does not have a permanent address. Patient came from the [facility].No further questions reported at this time. CSW to continue to follow and assist with discharge planning needs.  Expected Discharge Plan: Skilled Nursing Facility Barriers to Discharge: Continued Medical Work up   Patient Goals and CMS Choice Patient states their goals for this hospitalization and ongoing recovery are:: to go to SNF CMS Medicare.gov Compare Post Acute Care list provided to:: Patient Choice offered to / list presented to : Patient  Expected Discharge Plan and Services Expected Discharge Plan: Skilled Nursing Facility       Living arrangements for the past 2 months: Homeless Shelter                                      Prior Living Arrangements/Services Living arrangements for the past 2 months: Homeless Shelter   Patient language and need for interpreter reviewed:: Yes Do you feel safe going back to the place where you live?: No   SNF  Need for Family Participation in Patient Care: Yes (Comment) Care giver support system in place?: Yes (comment)   Criminal Activity/Legal Involvement Pertinent to Current Situation/Hospitalization: No - Comment as needed  Activities of Daily Living Home Assistive Devices/Equipment: Cane (specify  quad or straight), CBG Meter ADL Screening (condition at time of admission) Patient's cognitive ability adequate to safely complete daily activities?: Yes Is the patient deaf or have difficulty hearing?: No Does the patient have difficulty seeing, even when wearing glasses/contacts?: No Does the patient have difficulty concentrating, remembering, or making decisions?: No Patient able to express need for assistance with ADLs?: Yes Does the patient have difficulty dressing or bathing?: No Independently performs ADLs?: Yes (appropriate for developmental age) Does the patient have difficulty walking or climbing stairs?: No Weakness of Legs: None Weakness of Arms/Hands: None  Permission Sought/Granted Permission sought to share information with : Case Manager, Family Supports, Photographer granted to share info w AGENCY: SNF        Emotional Assessment Appearance:: Appears stated age Attitude/Demeanor/Rapport: Gracious Affect (typically observed): Calm Orientation: : Oriented to Self, Oriented to Place, Oriented to  Time, Oriented to Situation (WDL) Alcohol / Substance Use: Not Applicable Psych Involvement: No (comment)  Admission diagnosis:  Elevated bilirubin [R17] Sepsis (HCC) [A41.9] Sepsis without acute organ dysfunction, due to unspecified organism [facility]) [A41.9] Patient Active Problem List   Diagnosis Date Noted  . Sepsis (HCC) 01/17/2020  . Cellulitis of right lower extremity 01/17/2020  . Normochromic normocytic anemia 01/17/2020  . Uncontrolled type 2 diabetes mellitus with hyperglycemia (HCC) 01/17/2020  . Essential hypertension 01/17/2020   PCP:  Patient, No Pcp Per Pharmacy:  [facility] - [place], [place] - [address] [place] [address] Phone: [phone] Fax: [phone]  [facility] - [place], [place] - [address] [place]  [address] Phone: [phone] Fax: [phone]     Social Determinants of Health (SDOH) Interventions    Readmission Risk Interventions No flowsheet data found.

## 2020-01-21 ENCOUNTER — Inpatient Hospital Stay (HOSPITAL_COMMUNITY): Payer: 59

## 2020-01-21 DIAGNOSIS — N5089 Other specified disorders of the male genital organs: Secondary | ICD-10-CM

## 2020-01-21 LAB — VANCOMYCIN, TROUGH: Vancomycin Tr: 28 ug/mL (ref 15–20)

## 2020-01-21 LAB — GLUCOSE, CAPILLARY
Glucose-Capillary: 93 mg/dL (ref 70–99)
Glucose-Capillary: 179 mg/dL — ABNORMAL HIGH (ref 70–99)
Glucose-Capillary: 157 mg/dL — ABNORMAL HIGH (ref 70–99)
Glucose-Capillary: 185 mg/dL — ABNORMAL HIGH (ref 70–99)

## 2020-01-21 LAB — ANTINUCLEAR ANTIBODIES, IFA: ANA Ab, IFA: NEGATIVE

## 2020-01-21 LAB — CERULOPLASMIN: Ceruloplasmin: 27.7 mg/dL (ref 16.0–31.0)

## 2020-01-21 LAB — ALPHA-1-ANTITRYPSIN: A-1 Antitrypsin, Ser: 263 mg/dL — ABNORMAL HIGH (ref 101–187)

## 2020-01-21 MED ORDER — CEFAZOLIN SODIUM-DEXTROSE 1-4 GM/50ML-% IV SOLN
1.0000 g | Freq: Three times a day (TID) | INTRAVENOUS | Status: DC
  Administered 2020-01-21 – 2020-01-24 (×8): 1 g via INTRAVENOUS
  Filled 2020-01-21 (×10): qty 50

## 2020-01-21 NOTE — Consult Note (Signed)
[facility] for Infectious Disease    Date of Admission:  01/17/2020     Total days of antibiotics 4  Vancomycin + cefepime + flagyl                Reason for Consult: Cellulitis RLE   Referring Provider: [place]  Primary Care Provider: Patient, No Pcp Per   Assessment: Dennis Green is a 54 y.o. male admitted 4 days for management of right upper leg cellulitis. He has had progressively more swelling accumulate over the last month per his description in his legs prior to current hospitalization. He has had improvement with regards to treatment of infection but remains diffusely swollen in lower extremities. He has no underlying deep infection or abscess per imaging done. I suspect that he will be ready to convert to oral regimen tomorrow. Given non-purulent nature of current infection will narrow to cefazolin alone and plan to continue with cephalexin 500 mg QID given his body size. He likely has some component of lymphedema underlying and would benefit from some compression therapy if he can tolerate them. Cool compresses to help with the pain may be helpful. Given his diabetes would avoid prednisone to assist with inflammation.   I suspect his peripheral edema may be due to chronic medical issues. Venous ultrasound revealed no clot. May benefit from venous reflux study outpatient. He does have a history of cirrhosis; mild ascites noted on ultrasound - no fluid wave on exam or other findings concerning for advanced cirrhosis. Hepatitis panel negative and he does not drink alcohol.   Scrotal edema without any lymphadenopathy likely unrelated to infection but more reflection of fluid shifting and dependent edema - ultrasound pending. No bladder symptoms at all.     Plan: 1. Change antimicrobials to cefazolin   2. Consider conversion to oral cephalexin 500 mg QID PO tomorrow to finish out 2-4 more days of therapy depending how he does  3. Compression therapy  4. Cool compresses    5. Elevate legs  6. Elevate scrotum 7. May need diuretics to help with fluid removal?   Principal Problem:   Cellulitis of right lower extremity Active Problems:   Sepsis (HCC)   Normochromic normocytic anemia   Uncontrolled type 2 diabetes mellitus with hyperglycemia ([employer])   Essential hypertension   . influenza vac split quadrivalent PF  0.5 mL Intramuscular Tomorrow-1000  . insulin aspart  0-20 Units Subcutaneous TID WC  . insulin aspart  0-5 Units Subcutaneous QHS  . insulin aspart  5 Units Subcutaneous TID WC  . insulin glargine  25 Units Subcutaneous QHS  . losartan  50 mg Oral Daily  . pneumococcal 23 valent vaccine  0.5 mL Intramuscular Tomorrow-1000    HPI: Dennis Green is a 54 y.o. male admitted from home with a 2 week history of right leg swelling and pain/erythema with fevers, chills and vomiting.   Dennis Green states that he has never had an infection like this before. He has noticed an increase in swelling of the legs more over the last few months. He has needed to use a cane to get around. He does not recall any injury to his limb that prompted this swelling/erythema. He describes the swelling came on progressively and then the swelling, heat and redness came on more suddenly prior to the chills, fevers and malaise. No animal bites or exposures.   No recent antibiotic use. No purulence or open areas. Just describes a ret hot splotchy  area to the inner right thigh and overlying hamstring. He states that he is feeling quite a bit better since receiving antibiotics. His most concerning worry now is the swelling of his scrotum that has evolved since admission. No pain passing urine but "anatomically difficult". Awaiting scrotal ultrasound. He has not noticed any groin lymphadenopathy.    Review of Systems: Review of Systems  Constitutional: Positive for chills, fever and malaise/fatigue.  Cardiovascular: Positive for leg swelling.  Gastrointestinal: Positive for nausea and  vomiting.  Genitourinary: Negative for dysuria.       Challenges getting urine in urinal.   Skin: Positive for rash.  Neurological: Positive for weakness.  All other systems reviewed and are negative.   Past Medical History:  Diagnosis Date  . Diabetes mellitus without complication ([place])   . Hypertension     Social History   Tobacco Use  . Smoking status: Never Smoker  . Smokeless tobacco: Never Used  Vaping Use  . Vaping Use: Never used  Substance Use Topics  . Alcohol use: Yes    Comment: 2 drinks a year   . Drug use: Never    Family History  Problem Relation Age of Onset  . Hypertension Mother   . Hypertension Father   . Diabetes Father   . Cancer Sister    Allergies  Allergen Reactions  . Codeine Hives  . Hydrochlorothiazide Itching    OBJECTIVE: Blood pressure (!) 142/69, pulse 74, temperature 99.4 F (37.4 C), temperature source Oral, resp. rate 20, height _0  (1.753 m), weight 129.7 kg, SpO2 97 %.  Physical Exam Constitutional:      Appearance: Normal appearance.     Comments: Resting comfortably in bed.   HENT:     Mouth/Throat:     Mouth: Mucous membranes are moist.     Pharynx: Oropharynx is clear.  Eyes:     Pupils: Pupils are equal, round, and reactive to light.  Cardiovascular:     Rate and Rhythm: Normal rate and regular rhythm.     Heart sounds: No murmur heard.   Pulmonary:     Effort: Pulmonary effort is normal.     Breath sounds: Normal breath sounds.  Abdominal:     General: There is distension.     Palpations: Abdomen is soft.     Tenderness: There is no abdominal tenderness.     Comments: Pitting flank edema noted around hips and up to abdomen.   Genitourinary:    Comments: Scrotal swelling noted Musculoskeletal:        General: Tenderness present.     Left lower leg: Edema present.     Comments: Tense pitting edema throughout the right leg. He has an area remaining overlying the right inner thigh that resembles a sunburn  and is warm and pink. Tender.  Left leg is swollen as well to a lesser effect.  Onychomycosis to right toes  Skin:    Capillary Refill: Capillary refill takes less than 2 seconds.  Neurological:     Mental Status: He is alert and oriented to person, place, and time.     Lab Results Lab Results  Component Value Date   WBC 8.5 01/19/2020   HGB 12.3 (L) 01/19/2020   HCT 36.5 (L) 01/19/2020   MCV 89.9 01/19/2020   PLT 61 (L) 01/19/2020    Lab Results  Component Value Date   CREATININE 0.80 01/19/2020   BUN 19 01/19/2020   NA 129 (L) 01/19/2020   K 4.2 01/19/2020  CL 101 01/19/2020   CO2 21 (L) 01/19/2020    Lab Results  Component Value Date   ALT 16 01/19/2020   AST 34 01/19/2020   ALKPHOS 82 01/19/2020   BILITOT 2.2 (H) 01/19/2020     Microbiology: Recent Results (from the past 240 hour(s))  Blood Culture (routine x 2)     Status: None (Preliminary result)   Collection Time: 01/17/20  2:30 PM   Specimen: BLOOD  Result Value Ref Range Status   Specimen Description BLOOD LEFT ANTECUBITAL  Final   Special Requests   Final    BOTTLES DRAWN AEROBIC AND ANAEROBIC Blood Culture results may not be optimal due to an inadequate volume of blood received in culture bottles   Culture   Final    NO GROWTH 4 DAYS Performed at [facility], [place] [address], [place], [place] [address]    Report Status PENDING  Incomplete  Blood Culture (routine x 2)     Status: None (Preliminary result)   Collection Time: 01/17/20  2:30 PM   Specimen: BLOOD  Result Value Ref Range Status   Specimen Description BLOOD RIGHT ANTECUBITAL  Final   Special Requests   Final    BOTTLES DRAWN AEROBIC AND ANAEROBIC Blood Culture adequate volume   Culture   Final    NO GROWTH 4 DAYS Performed at [facility], [place] [address], [place], [place] [address]    Report Status PENDING  Incomplete  SARS Coronavirus 2 by RT PCR (hospital order, performed in [facility] hospital lab) Nasopharyngeal  Nasopharyngeal Swab     Status: None   Collection Time: 01/17/20  3:17 PM   Specimen: Nasopharyngeal Swab  Result Value Ref Range Status   SARS Coronavirus 2 NEGATIVE NEGATIVE Final    Comment: (NOTE) SARS-CoV-2 target nucleic acids are NOT DETECTED.  The SARS-CoV-2 RNA is generally detectable in upper and lower respiratory specimens during the acute phase of infection. The lowest concentration of SARS-CoV-2 viral copies this assay can detect is 250 copies / mL. A negative result does not preclude SARS-CoV-2 infection and should not be used as the sole basis for treatment or other patient management decisions.  A negative result may occur with improper specimen collection / handling, submission of specimen other than nasopharyngeal swab, presence of viral mutation(s) within the areas targeted by this assay, and inadequate number of viral copies (<250 copies / mL). A negative result must be combined with clinical observations, patient history, and epidemiological information.  Fact Sheet for Patients:   StrictlyIdeas.no  Fact Sheet for Healthcare Providers: BankingDealers.co.za  This test is not yet approved or  cleared by the [place] FDA and has been authorized for detection and/or diagnosis of SARS-CoV-2 by FDA under an Emergency Use Authorization (EUA).  This EUA will remain in effect (meaning this test can be used) for the duration of the COVID-19 declaration under Section 564(b)(1) of the Act, 21 U.S.C. section 360bbb-3(b)(1), unless the authorization is terminated or revoked sooner.  Performed at [facility], [place] [address], [place],  [address]   Urine culture     Status: Abnormal   Collection Time: 01/17/20  4:16 PM   Specimen: In/Out Cath Urine  Result Value Ref Range Status   Specimen Description IN/OUT CATH URINE  Final   Special Requests NONE  Final   Culture (A)  Final    30,000 COLONIES/mL GROUP  B STREP(S.AGALACTIAE)ISOLATED TESTING AGAINST S. AGALACTIAE NOT ROUTINELY PERFORMED DUE TO  PREDICTABILITY OF AMP/PEN/VAN SUSCEPTIBILITY. Performed at [facility], [place] [address] [place], [place] [address]    Report Status 01/18/2020 FINAL  Final    Janene Madeira, MSN, NP-C [facility] [email or web address] Pager: [phone] Office: [phone] [place]: [phone]

## 2020-01-21 NOTE — Progress Notes (Signed)
PT Cancellation Note  Patient Details Name: Dennis Green MRN: [id number] DOB: 1966/03/11   Cancelled Treatment:    Reason Eval/Treat Not Completed: (P) Patient at procedure or test/unavailable Pt off floor for [place]. PT will follow back for treatment as able.   Myles Mallicoat B. Beverely Risen PT, DPT Acute Rehabilitation Services Pager [phone] Office [phone]  Elon Alas Fleet 01/21/2020, 2:53 PM

## 2020-01-21 NOTE — Progress Notes (Signed)
Physical Therapy Treatment Patient Details Name: Dennis Green MRN: [id number] DOB: 01-Jan-1966 Today's Date: 01/21/2020    History of Present Illness 54 year old gentleman with prior history of hypertension, type 2 diabetes mellitus reports worsening pain in the right lower extremity over the past 2 weeks also reports bilateral leg edema for over a month.  On arrival to ED he was found to be febrile, elevated lactic acid,  CT of the right leg shows features consistent with diffuse cellulitis of the leg.  Blood cultures were drawn and sepsis protocol was initiated.  Venous duplex was ordered and is negative for acute DVT.    PT Comments    Pt supine in bed after [place] of his scrotum in obvious pain. Suggest getting up and moving a little and pt reports he is somewhat comfortable and knows it will hurt a lot to get up. Pt agreeable to bed exercises. Educated on need for movement of R LE to engage muscle pumps to aid in decreasing edema. Propped pt LE up on pillows and placed pt in trendelenburg to assist in fluid reduction. Educated pt on bed controls so that he can return to bed flat. D/c plans remain appropriate. PT will continue to follow acutely.    Follow Up Recommendations  SNF;Supervision for mobility/OOB     Equipment Recommendations  Rolling walker with 5" wheels       Precautions / Restrictions Precautions Precautions: Fall Precaution Comments: pt denies falls in past 1 year Restrictions Weight Bearing Restrictions: No    Mobility  Bed Mobility               General bed mobility comments: deferred due to increased pain with movement             Cognition Arousal/Alertness: Awake/alert Behavior During Therapy: WFL for tasks assessed/performed Overall Cognitive Status: Within Functional Limits for tasks assessed                                        Exercises General Exercises - Lower Extremity Ankle Circles/Pumps: AROM;Both;20 reps;Supine Quad  Sets: AROM;Both;10 reps;Supine Gluteal Sets: AROM;Both;10 reps;Supine Heel Slides: AROM;Left;10 reps;AAROM;Right Hip ABduction/ADduction: AROM;Both;10 reps;Supine Straight Leg Raises: AROM;Left;10 reps;AAROM;Right    General Comments General comments (skin integrity, edema, etc.): Pt with 2+ pitting edema in R LE       Pertinent Vitals/Pain Pain Assessment: 0-10 Pain Score: 9  Pain Location: R LE and scrotum  Pain Descriptors / Indicators: Guarding;Grimacing Pain Intervention(s): Limited activity within patient's tolerance;Monitored during session;Repositioned           PT Goals (current goals can now be found in the care plan section) Acute Rehab PT Goals Patient Stated Goal: reduce pain, be able to walk PT Goal Formulation: With patient Time For Goal Achievement: 02/02/20 Potential to Achieve Goals: Good Progress towards PT goals: Not progressing toward goals - comment (increased pain in LE and scrotum with mobility )    Frequency    Min 3X/week      PT Plan Current plan remains appropriate       AM-PAC PT "6 Clicks" Mobility   Outcome Measure  Help needed turning from your back to your side while in a flat bed without using bedrails?: None Help needed moving from lying on your back to sitting on the side of a flat bed without using bedrails?: A Little Help needed moving to and from a  bed to a chair (including a wheelchair)?: A Little Help needed standing up from a chair using your arms (e.g., wheelchair or bedside chair)?: A Little Help needed to walk in hospital room?: A Little Help needed climbing 3-5 steps with a railing? : A Lot 6 Click Score: 18    End of Session Equipment Utilized During Treatment: Gait belt Activity Tolerance: Patient limited by pain Patient left: in chair;with call bell/phone within reach;with chair alarm set Nurse Communication: Mobility status PT Visit Diagnosis: Difficulty in walking, not elsewhere classified (R26.2);Pain Pain -  Right/Left: Right Pain - part of body: Leg     Time: 0981-1914 PT Time Calculation (min) (ACUTE ONLY): 19 min  Charges:  $Therapeutic Exercise: 8-22 mins                     Adda Stokes B. Beverely Risen PT, DPT Acute Rehabilitation Services Pager [phone] Office [phone]    Elon Alas Fleet 01/21/2020, 4:55 PM

## 2020-01-21 NOTE — Progress Notes (Signed)
Report given to [facility], Charity fundraiser. Patient is alert and oriented x4 sitting up in bed with call light in reach. VSS. ACHS. SR on tele. IVF infusing along with intermittent antibiotics. No labs as of yet this AM. PRN pain medication given overnight x2 for RLE discomfort related to cellulitis. Plan is for patient to go to SNF placement when insurance is approved. IV antibiotics in place until 01/22/2020 at minimum.

## 2020-01-21 NOTE — Consult Note (Signed)
Reason for Consult:Scrotal Cellulitis, Right Solitary Testicle  Referring Physician: Mauro Kaufmann MD  Dennis Green is an 54 y.o. male.   HPI:   1- Scrotal Cellulitis - few days of RLE swelling, erythema, pain c/w cellulitis prior to admission. Now on Vanc + Zosyn and some progression to scrotal area. Exam and [place] w/o abscess / crepitus. No voiding complaints. H/o diabetes.  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 He has a left inguinal scar that he does not know history of from childood. No recetn abd imaging.  PMH sig for DM2, HTN. He is homeless.   Today "Dennis Green" is seen in consultation for above, specifically to rule out surgical indications for his scrotal cellulitis.   Past Medical History:  Diagnosis Date  . Diabetes mellitus without complication (HCC)   . Hypertension     Past Surgical History:  Procedure Laterality Date  . CHOLECYSTECTOMY    . VASECTOMY      Family History  Problem Relation Age of Onset  . Hypertension Mother   . Hypertension Father   . Diabetes Father   . Cancer Sister     Social History:  reports that he has never smoked. He has never used smokeless tobacco. He reports current alcohol use. He reports that he does not use drugs.  Allergies:  Allergies  Allergen Reactions  . Codeine Hives  . Hydrochlorothiazide Itching    Medications: I have reviewed the patient's current medications.  Results for orders placed or performed during the hospital encounter of 01/17/20 (from the past 48 hour(s))  Glucose, capillary     Status: Abnormal   Collection Time: 01/19/20  8:46 PM  Result Value Ref Range   Glucose-Capillary 160 (H) 70 - 99 mg/dL    Comment: Glucose reference range applies only to samples taken after fasting for at least 8 hours.  Glucose, capillary     Status: Abnormal   Collection Time: 01/20/20 12:19 AM  Result Value Ref Range   Glucose-Capillary 167 (H) 70 - 99 mg/dL    Comment: Glucose  reference range applies only to samples taken after fasting for at least 8 hours.  ANA, IFA (with reflex)     Status: None   Collection Time: 01/20/20  5:52 AM  Result Value Ref Range   ANA Ab, IFA Negative     Comment: (NOTE)                                     Negative   <1:80                                     Borderline  1:80                                     Positive   >1:80 ICAP nomenclature: AC-0 For more information about Hep-2 cell patterns use ANApatterns.org, the official website for the International Consensus on Antinuclear Antibody (ANA) Patterns (ICAP). Performed At: [facility] [address] [place], [place] [id number] Jolene Schimke MD CB:[id number]   Hepatitis C antibody     Status: None   Collection Time: 01/20/20  5:52 AM  Result Value Ref Range   HCV Ab NON REACTIVE NON REACTIVE  Comment: (NOTE) Nonreactive HCV antibody screen is consistent with no HCV infections,  unless recent infection is suspected or other evidence exists to indicate HCV infection.  Performed at [facility], [address] [place], [place] [address]   Hepatitis B surface antigen     Status: None   Collection Time: 01/20/20  5:52 AM  Result Value Ref Range   Hepatitis B Surface Ag NON REACTIVE NON REACTIVE    Comment: Performed at [facility], [address] [place], [place] [address]  Hepatitis A antibody, total     Status: None   Collection Time: 01/20/20  5:52 AM  Result Value Ref Range   hep A Total Ab NON REACTIVE NON REACTIVE    Comment: Performed at [facility], [address] [place], [place] [address]  Ceruloplasmin     Status: None   Collection Time: 01/20/20  5:52 AM  Result Value Ref Range   Ceruloplasmin 27.7 16.0 - 31.0 mg/dL    Comment: (NOTE) Performed At: [facility] [address] [place], [place] [id number] Jolene SchimkeNagendra Sanjai MD XN:[id number]:[phone]   Alpha-1-antitrypsin     Status: Abnormal   Collection Time: 01/20/20   5:52 AM  Result Value Ref Range   A-1 Antitrypsin, Ser 263 (H) 101 - 187 mg/dL    Comment: (NOTE) Performed At: [facility]  [address] [place], [place] [id number] Jolene SchimkeNagendra Sanjai MD SE:[id number]:[phone]   Hepatitis B surface antibody,qualitative     Status: None   Collection Time: 01/20/20  5:52 AM  Result Value Ref Range   Hep B S Ab NON REACTIVE NON REACTIVE    Comment: (NOTE) Inconsistent with immunity, less than 10 mIU/mL.  Performed at [facility], [address] [place], [place] [address]   Glucose, capillary     Status: Abnormal   Collection Time: 01/20/20  7:45 AM  Result Value Ref Range   Glucose-Capillary 117 (H) 70 - 99 mg/dL    Comment: Glucose reference range applies only to samples taken after fasting for at least 8 hours.  Glucose, capillary     Status: Abnormal   Collection Time: 01/20/20 11:58 AM  Result Value Ref Range   Glucose-Capillary 213 (H) 70 - 99 mg/dL    Comment: Glucose reference range applies only to samples taken after fasting for at least 8 hours.  Glucose, capillary     Status: Abnormal   Collection Time: 01/20/20  5:15 PM  Result Value Ref Range   Glucose-Capillary 154 (H) 70 - 99 mg/dL    Comment: Glucose reference range applies only to samples taken after fasting for at least 8 hours.  Glucose, capillary     Status: Abnormal   Collection Time: 01/20/20  8:34 PM  Result Value Ref Range   Glucose-Capillary 127 (H) 70 - 99 mg/dL    Comment: Glucose reference range applies only to samples taken after fasting for at least 8 hours.  Glucose, capillary     Status: None   Collection Time: 01/21/20  7:52 AM  Result Value Ref Range   Glucose-Capillary 93 70 - 99 mg/dL    Comment: Glucose reference range applies only to samples taken after fasting for at least 8 hours.  Vancomycin, trough     Status: Abnormal   Collection Time: 01/21/20 10:59 AM  Result Value Ref Range   Vancomycin Tr 28 (HH) 15 - 20 ug/mL    Comment: CRITICAL RESULT  CALLED TO, READ BACK BY AND  VERIFIED WITH: IMAI,Y RN @1215  ON BY FLEMINGS Performed at [facility], [address] [place], [place]   Glucose, capillary     Status: Abnormal   Collection Time: 01/21/20 11:13 AM  Result Value Ref Range   Glucose-Capillary 179 (H) 70 - 99 mg/dL    Comment: Glucose reference range applies only to samples taken after fasting for at least 8 hours.    01/23/20 SCROTUM  Result Date: 01/21/2020 CLINICAL DATA:  Scrotal swelling since this morning EXAM: ULTRASOUND OF SCROTUM TECHNIQUE: Complete ultrasound examination of the testicles, epididymis, and other scrotal structures was performed. COMPARISON:  None. FINDINGS: Right testicle Measurements: 5.8 x 3.6 x 3.6 cm. Uniform parenchymal echogenicity. No mass or microlithiasis. Left testicle Not visualized. Right epididymis: Suboptimal visualization of the right epididymis. Question some mild heterogeneity seen on sagittal imaging of the right testicle (16/28). Left epididymis:  Not visualized Hydrocele: Slight increase in the amount of normal physiologic paratesticular fluid on the right. Varicocele:  None visualized. Other: Diffuse scrotal skin thickening and edematous changes. IMPRESSION: Absence of the left testicle. Correlate for surgical history or congenital absence. Suboptimal visualization of the right epididymis. Questionable heterogeneity seen on sagittal imaging of the testicle could reflect an epididymitis. No convincing features of right orchitis. Trace right hydrocele, likely reactive. Diffuse scrotal skin thickening and edematous changes. Correlate for features of cellulitis. No organized abscess or collection. Electronically Signed   By: 03-15-1975 M.D.   On: 01/21/2020 17:41    Review of Systems  Constitutional: Positive for fatigue and fever.  HENT: Negative.   Eyes: Negative.   Respiratory: Negative.   Cardiovascular: Negative.   Gastrointestinal: Negative.   Endocrine: Negative.    Genitourinary: Positive for scrotal swelling. Negative for difficulty urinating.  Musculoskeletal: Positive for joint swelling.  Skin: Positive for color change.  Neurological: Negative.   Hematological: Negative.   Psychiatric/Behavioral: Negative.   All other systems reviewed and are negative.  Blood pressure 138/72, pulse 87, temperature 97.9 F (36.6 C), temperature source Oral, resp. rate 20, height 5\' 9"  (1.753 m), weight 129.7 kg, SpO2 97 %. Physical Exam HENT:     Head: Normocephalic.     Nose: Nose normal.     Mouth/Throat:     Mouth: Mucous membranes are moist.  Cardiovascular:     Rate and Rhythm: Normal rate.     Pulses: Normal pulses.  Pulmonary:     Effort: Pulmonary effort is normal.  Abdominal:     Comments: Moderate obesity. NO CVAT.   Genitourinary:    Penis: Normal.      Comments: Soft scrotal wall edema and non-beefy overlying erythema c/w cellulits. No crepitus / fluctuence / necrosis.  Musculoskeletal:     Cervical back: Normal range of motion.     Comments: RLE warmth and erythema of skin c/w cellulitis. No necrotis / crepitus / fluctuence.   Skin:    General: Skin is warm.  Neurological:     General: No focal deficit present.     Mental Status: He is alert.  Psychiatric:        Mood and Affect: Mood normal.     Assessment/Plan: 1- Scrotal Cellulitis - fortunately this appears mostly just extension of leg cellulits. NO necrotizing infections / drainagble collections. Agree with current ABX pending additional data. Most of his scrotal swelling is reactive edema and will slowly resolve as infection clears. Will follow to r/o progression to organized abscesses.   2 - Right Solitary  Testicle - unclear if left testi in place of likely resolved / nubbin with prior orchidopexy given his left inguinal scar. No indication for intervention at this point. Conssider abd/pelvis imagign in elective setting to r/o post-pubertal intra-abdominal testis.   Sebastian Ache 01/21/2020, 6:03 PM

## 2020-01-21 NOTE — Progress Notes (Signed)
OT Cancellation Note  Patient Details Name: Dennis Green MRN: [id number] DOB: [birth year]   Cancelled Treatment:    Reason Eval/Treat Not Completed: Patient at procedure or test/ unavailable (Leaving for [place]. WIll return as schedule allows. )  Adrielle Polakowski M Novalee Horsfall Tashe Purdon MSOT, OTR/L Acute Rehab Pager: [phone] Office: [phone] 01/21/2020, 2:50 PM

## 2020-01-21 NOTE — Progress Notes (Signed)
[facility]  PROGRESS NOTE  Dennis Green DQQ:[id number] DOB: July 08, 1965 DOA: 01/17/2020 PCP: Patient, No Pcp Per   Brief HPI:   54 year old male with history of hypertension, diabetes mellitus type 2, presented with worsening pain right lower extremity for past 2 weeks.  Also reports bilateral leg edema for a month.  Denied chest pain or shortness of breath.  In the ED he was found to have elevated lactic acid.  CT of the right leg showed features consistent with diffuse cellulitis of the leg.  Blood cultures were drawn and sepsis protocol was initiated.  Venous duplex of lower extremities was negative for acute DVT.    Subjective   Patient seen and examined, today complains of redness and swelling spreading to scrotal area.  Also complains of pain in scrotum.   Assessment/Plan:     1. Sepsis-secondary to cellulitis of right posterior thigh, sepsis physiology has resolved.  Continue IV antibiotics including vancomycin, Flagyl, cefepime.  Venous duplex of lower extremities negative for DVT. 2. Scrotal cellulitis-patient presented with right posterior thigh cellulitis and has been getting antibiotics vancomycin, cefepime, Flagyl.  Redness and swelling has spread to the scrotal area.  Will obtain scrotal ultrasound.  I called and discussed with urology Dr. Berneice Heinrich who will see patient today. 3. Hypertension-blood pressure is now elevated, restart Cozaar 100 mg p.o. daily. 4. Diabetes mellitus type 2-hemoglobin A1c was 8.8.  Continue Lantus 35 units subcu daily.  Resistant sliding scale.  NovoLog 5 units 3 times daily with meals.  Blood glucose is fairly well controlled. 5. Anemia of chronic disease/normocytic anemia-hemoglobin stable at 11. 6. Liver cirrhosis /thrombocytopenia-platelet count 61,000 today.  Abdominal ultrasound shows cirrhotic liver.  Patient denies heavy alcohol use in the past.  No previous history of hepatitis.  Will check hepatitis panel.  Will need GI evaluation as  outpatient.  No signs of hepatic decompensation at this time.  LFTs are normal.patient has elevated thrombin time, hypoalbuminemia, thrombocytopenia, abdominal ultrasound shows cirrhotic liver.  Unclear etiology, called [facility], appointment has been set up as outpatient.  Work-up for liver cirrhosis has been started.  Labs drawn in the hospital. 7. Hyponatremia-sodium 129, likely from underlying liver cirrhosis.   COVID-19 Labs  No results for input(s): DDIMER, FERRITIN, LDH, CRP in the last 72 hours.  Lab Results  Component Value Date   SARSCOV2NAA NEGATIVE 01/17/2020     Scheduled medications:   . influenza vac split quadrivalent PF  0.5 mL Intramuscular Tomorrow-1000  . insulin aspart  0-20 Units Subcutaneous TID WC  . insulin aspart  0-5 Units Subcutaneous QHS  . insulin aspart  5 Units Subcutaneous TID WC  . insulin glargine  25 Units Subcutaneous QHS  . losartan  50 mg Oral Daily  . pneumococcal 23 valent vaccine  0.5 mL Intramuscular Tomorrow-1000    CBG: Recent Labs  Lab 01/20/20 1158 01/20/20 1715 01/20/20 2034 01/21/20 0752 01/21/20 1113  GLUCAP 213* 154* 127* 93 179*    SpO2: 97 %    CBC: Recent Labs  Lab 01/17/20 1515 01/17/20 1515 01/17/20 2209 01/18/20 0500 01/18/20 0912 01/18/20 2043 01/19/20 1004  WBC 7.7  --  6.5 6.6  --   --  8.5  NEUTROABS 6.5  --   --  5.4  --   --  7.0  HGB 11.1*  --  11.0* 10.9*  --   --  12.3*  HCT 33.3*  --  32.8* 32.6*  --   --  36.5*  MCV 90.7  --  91.1 91.1  --   --  89.9  PLT PLATELET CLUMPS NOTED ON SMEAR, UNABLE TO ESTIMATE   < > PLATELET CLUMPS NOTED ON SMEAR, UNABLE TO ESTIMATE 58* 55* 76* 61*   < > = values in this interval not displayed.    Basic Metabolic Panel: Recent Labs  Lab 01/17/20 1515 01/17/20 1515 01/17/20 2209 01/18/20 0500 01/18/20 2043 01/19/20 1004 01/19/20 1742  NA 123*  --   --  127* 127* 128* 129*  K 4.2  --   --  3.8 3.9 3.8 4.2  CL 93*  --   --  98 98 99 101  CO2 16*  --   --  20*  19* 19* 21*  GLUCOSE 377*  --   --  254* 231* 182* 202*  BUN 20  --   --  16 17 17 19   CREATININE 1.04   < > 1.02 0.90 0.87 0.90 0.80  CALCIUM 7.4*  --   --  7.5* 7.6* 7.6* 7.5*   < > = values in this interval not displayed.     Liver Function Tests: Recent Labs  Lab 01/17/20 1515 01/18/20 0500 01/19/20 1004  AST 24 20 34  ALT 14 14 16   ALKPHOS 99 77 82  BILITOT 3.4* 3.0* 2.2*  PROT 5.6* 5.5* 5.7*  ALBUMIN 2.0* 1.9* 1.9*     Antibiotics: Anti-infectives (From admission, onward)   Start     Dose/Rate Route Frequency Ordered Stop   01/18/20 2200  vancomycin (VANCOREADY) IVPB 1250 mg/250 mL        1,250 mg 166.7 mL/hr over 90 Minutes Intravenous Every 12 hours 01/18/20 0846     01/18/20 1600  ceFEPIme (MAXIPIME) 2 g in sodium chloride 0.9 % 100 mL IVPB        2 g 200 mL/hr over 30 Minutes Intravenous Every 8 hours 01/18/20 0842     01/18/20 0630  metroNIDAZOLE (FLAGYL) IVPB 500 mg        500 mg 100 mL/hr over 60 Minutes Intravenous Every 8 hours 01/18/20 0613     01/18/20 0000  ceFEPIme (MAXIPIME) 1 g in sodium chloride 0.9 % 100 mL IVPB  Status:  Discontinued        1 g 200 mL/hr over 30 Minutes Intravenous Every 8 hours 01/17/20 1735 01/18/20 0842   01/18/20 0000  vancomycin (VANCOREADY) IVPB 1250 mg/250 mL  Status:  Discontinued        1,250 mg 166.7 mL/hr over 90 Minutes Intravenous Every 8 hours 01/17/20 1735 01/18/20 0846   01/17/20 1530  vancomycin (VANCOREADY) IVPB 2000 mg/400 mL        2,000 mg 200 mL/hr over 120 Minutes Intravenous  Once 01/17/20 1444 01/17/20 1724   01/17/20 1445  ceFEPIme (MAXIPIME) 2 g in sodium chloride 0.9 % 100 mL IVPB        2 g 200 mL/hr over 30 Minutes Intravenous  Once 01/17/20 1442 01/17/20 1513   01/17/20 1445  metroNIDAZOLE (FLAGYL) IVPB 500 mg        500 mg 100 mL/hr over 60 Minutes Intravenous  Once 01/17/20 1442 01/17/20 1623   01/17/20 1445  vancomycin (VANCOCIN) IVPB 1000 mg/200 mL premix  Status:  Discontinued        1,000  mg 200 mL/hr over 60 Minutes Intravenous  Once 01/17/20 1442 01/17/20 1444       DVT prophylaxis: Heparin  Code Status: Full code  Family Communication: No family at bedside  Status is: Inpatient  Dispo: The patient is from: Home              Anticipated d/c is to: Home versus skilled nursing facility              Anticipated d/c date is: 01/22/2020              Patient currently not medically stable for discharge  Barrier to discharge-ongoing IV antibiotics for right lower extremity cellulitis, scrotal cellulitis     Consultants:  None  Procedures:  None   Objective   Vitals:   01/21/20 0315 01/21/20 0753 01/21/20 1000 01/21/20 1112  BP: (!) 143/54 (!) 122/55 128/60 (!) 142/69  Pulse: 75 66  74  Resp: 19 20  20   Temp: 100.1 F (37.8 C) 98.8 F (37.1 C)  99.4 F (37.4 C)  TempSrc: Oral Oral  Oral  SpO2: 94% 96%  97%  Weight:      Height:        Intake/Output Summary (Last 24 hours) at 01/21/2020 1352 Last data filed at 01/21/2020 1000 Gross per 24 hour  Intake 3071.34 ml  Output 650 ml  Net 2421.34 ml    09/15 1901 - 09/17 0700 In: 3630.5 [P.O.:960; I.V.:1267.3] Out: 1925 [Urine:1925]  Filed Weights   01/17/20 1427 01/18/20 2028 01/20/20 0629  Weight: 132 kg 127.1 kg 129.7 kg    Physical Examination:   General-appears in no acute distress  Heart-S1-S2, regular, no murmur auscultated  Lungs-clear to auscultation bilaterally, no wheezing or crackles auscultated  Abdomen-soft, nontender, no organomegaly, erythema and swelling noted in the scrotum.  Tender to palpation.  Extremities-no edema in the lower extremities  Neuro-alert, oriented x3, no focal deficit noted    Data Reviewed:   Recent Results (from the past 240 hour(s))  Blood Culture (routine x 2)     Status: None (Preliminary result)   Collection Time: 01/17/20  2:30 PM   Specimen: BLOOD  Result Value Ref Range Status   Specimen Description BLOOD LEFT ANTECUBITAL  Final    Special Requests   Final    BOTTLES DRAWN AEROBIC AND ANAEROBIC Blood Culture results may not be optimal due to an inadequate volume of blood received in culture bottles   Culture   Final    NO GROWTH 4 DAYS Performed at [facility], [address], [place], [place]    Report Status PENDING  Incomplete  Blood Culture (routine x 2)     Status: None (Preliminary result)   Collection Time: 01/17/20  2:30 PM   Specimen: BLOOD  Result Value Ref Range Status   Specimen Description BLOOD RIGHT ANTECUBITAL  Final   Special Requests   Final    BOTTLES DRAWN AEROBIC AND ANAEROBIC Blood Culture adequate volume   Culture   Final    NO GROWTH 4 DAYS Performed at [facility], [address], [place], [place]    Report Status PENDING  Incomplete  SARS Coronavirus 2 by RT PCR (hospital order, performed in [facility] hospital lab) Nasopharyngeal Nasopharyngeal Swab     Status: None   Collection Time: 01/17/20  3:17 PM   Specimen: Nasopharyngeal Swab  Result Value Ref Range Status   SARS Coronavirus 2 NEGATIVE NEGATIVE Final    Comment: (NOTE) SARS-CoV-2 target nucleic acids are NOT DETECTED.  The SARS-CoV-2 RNA is generally detectable in upper and lower respiratory specimens during the acute phase of infection. The lowest concentration of SARS-CoV-2  viral copies this assay can detect is 250 copies / mL. A negative result does not preclude SARS-CoV-2 infection and should not be used as the sole basis for treatment or other patient management decisions.  A negative result may occur with improper specimen collection / handling, submission of specimen other than nasopharyngeal swab, presence of viral mutation(s) within the areas targeted by this assay, and inadequate number of viral copies (<250 copies / mL). A negative result must be combined with clinical observations, patient history, and epidemiological information.  Fact Sheet for Patients:    BoilerBrush.com.cy  Fact Sheet for Healthcare Providers: https://pope.com/  This test is not yet approved or  cleared by the [place] FDA and has been authorized for detection and/or diagnosis of SARS-CoV-2 by FDA under an Emergency Use Authorization (EUA).  This EUA will remain in effect (meaning this test can be used) for the duration of the COVID-19 declaration under Section 564(b)(1) of the Act, 21 U.S.C. section 360bbb-3(b)(1), unless the authorization is terminated or revoked sooner.  Performed at [facility], [address] [place], [place] [address]   Urine culture     Status: Abnormal   Collection Time: 01/17/20  4:16 PM   Specimen: In/Out Cath Urine  Result Value Ref Range Status   Specimen Description IN/OUT CATH URINE  Final   Special Requests NONE  Final   Culture (A)  Final    30,000 COLONIES/mL GROUP B STREP(S.AGALACTIAE)ISOLATED TESTING AGAINST S. AGALACTIAE NOT ROUTINELY PERFORMED DUE TO PREDICTABILITY OF AMP/PEN/VAN SUSCEPTIBILITY. Performed at [facility], [address], [place], [place] [address]    Report Status 01/18/2020 FINAL  Final    No results for input(s): LIPASE, AMYLASE in the last 168 hours. No results for input(s): AMMONIA in the last 168 hours.  Cardiac Enzymes: No results for input(s): CKTOTAL, CKMB, CKMBINDEX, TROPONINI in the last 168 hours. BNP (last 3 results) Recent Labs    01/18/20 1212  BNP 648.6*     Studies:  No results found.     Meredeth Ide   [facility] If 7PM-7AM, please contact night-coverage at [email or web address], Office  [phone]   01/21/2020, 1:52 PM  LOS: 4 days

## 2020-01-21 NOTE — Progress Notes (Signed)
Pharmacy Antibiotic Note  Dennis Green is a 54 y.o. male admitted on 01/17/2020 with cellulitis.  Pharmacy has been consulted for Cefepime and vancomycin dosing.  Pt is no longer febrile and WBC is WNL as of 9/15. Scr is WNL. Vancomycin level this am was elevated at 28 but appears to be drawn during or after am dose was given. We try and recheck tonight.   Plan: Continue cefepime 2gm IV Q8H Continue vancomycin to 1250mg  IV Q12H Recheck vancomycin trough tonight  Height: 5\' 9"  (175.3 cm) Weight: 129.7 kg (285 lb 14.4 oz) IBW/kg (Calculated) : 70.7  Temp (24hrs), Avg:99.2 F (37.3 C), Min:98.4 F (36.9 C), Max:100.1 F (37.8 C)  Recent Labs  Lab 01/17/20 1515 01/17/20 1515 01/17/20 2108 01/17/20 2209 01/18/20 0007 01/18/20 0440 01/18/20 0500 01/18/20 1044 01/18/20 2043 01/19/20 1004 01/19/20 1742  WBC 7.7  --   --  6.5  --   --  6.6  --   --  8.5  --   CREATININE 1.04   < >  --  1.02  --   --  0.90  --  0.87 0.90 0.80  LATICACIDVEN 3.6*  --  2.5*  --  2.7* 2.6*  --  2.5*  --   --   --    < > = values in this interval not displayed.    Estimated Creatinine Clearance: 137.5 mL/min (by C-G formula based on SCr of 0.8 mg/dL).    Allergies  Allergen Reactions  . Codeine Hives  . Hydrochlorothiazide Itching    Antimicrobials this admission: Cefepime 9/13 >>  Vanc 9/13 >>  Flagyl 9/13>>  Dose adjustments this admission:  Microbiology results: 9/13 BCx: NGTD 9/13 UCx:  Group B strep   Thank you for allowing pharmacy to be a part of this patient's care.  10/13 PharmD., BCPS Clinical Pharmacist 01/21/2020 10:11 AM

## 2020-01-22 LAB — CULTURE, BLOOD (ROUTINE X 2)
Culture: NO GROWTH
Culture: NO GROWTH
Special Requests: ADEQUATE

## 2020-01-22 LAB — GLUCOSE, CAPILLARY
Glucose-Capillary: 141 mg/dL — ABNORMAL HIGH (ref 70–99)
Glucose-Capillary: 120 mg/dL — ABNORMAL HIGH (ref 70–99)
Glucose-Capillary: 143 mg/dL — ABNORMAL HIGH (ref 70–99)
Glucose-Capillary: 157 mg/dL — ABNORMAL HIGH (ref 70–99)

## 2020-01-22 LAB — MITOCHONDRIAL ANTIBODIES: Mitochondrial M2 Ab, IgG: <20 Units (ref 0.0–20.0)

## 2020-01-22 NOTE — Progress Notes (Signed)
[facility]  PROGRESS NOTE  Dennis Green VHQ:[id number] DOB: 10-08-1965 DOA: 01/17/2020 PCP: Patient, No Pcp Per   Brief HPI:   54 year old male with history of hypertension, diabetes mellitus type 2, presented with worsening pain right lower extremity for past 2 weeks.  Also reports bilateral leg edema for a month.  Denied chest pain or shortness of breath.  In the ED he was found to have elevated lactic acid.  CT of the right leg showed features consistent with diffuse cellulitis of the leg.  Blood cultures were drawn and sepsis protocol was initiated.  Venous duplex of lower extremities was negative for acute DVT.    Subjective   Patient seen and examined, feels better this morning.  Appreciate urology and ID input.   Assessment/Plan:     1. Sepsis-secondary to cellulitis of right posterior thigh, sepsis physiology has resolved.  Patient was started on vancomycin, Flagyl and cefepime, urine culture grew group B strep.  ID was consulted.  IV antibiotics have been changed to cefazolin as per ID.    Venous duplex of lower extremities negative for DVT. 2. Scrotal cellulitis-patient presented with right posterior thigh cellulitis and has been getting antibiotics vancomycin, cefepime, Flagyl.  Redness and swelling has spread to the scrotal area.  Scrotal ultrasound obtained shows 1 testicle, absent left testicle.  Also shows some hydrocele.  Urology was consulted.  No drainable abscess, scrotal cellulitis and swelling has improved today.   3. Hypertension-blood pressure is now elevated, restarted Cozaar 100 mg p.o. daily. 4. Diabetes mellitus type 2-hemoglobin A1c was 8.8.  Continue Lantus 35 units subcu daily.  Resistant sliding scale.  NovoLog 5 units 3 times daily with meals.  Blood glucose is fairly well controlled. 5. Anemia of chronic disease/normocytic anemia-hemoglobin stable at 11. 6. Liver cirrhosis /thrombocytopenia-platelet count 61,000 today.  Abdominal ultrasound shows  cirrhotic liver.  Patient denies heavy alcohol use in the past.  No previous history of hepatitis.  Will check hepatitis panel.  Will need GI evaluation as outpatient.  No signs of hepatic decompensation at this time.  LFTs are normal.patient has elevated thrombin time, hypoalbuminemia, thrombocytopenia, abdominal ultrasound shows cirrhotic liver.  Unclear etiology, called [facility], appointment has been set up as outpatient.  Work-up for liver cirrhosis has been started.  Labs drawn in the hospital.  Patient to follow-up with GI as outpatient. 7. Hyponatremia-sodium 129, likely from underlying liver cirrhosis.   COVID-19 Labs  No results for input(s): DDIMER, FERRITIN, LDH, CRP in the last 72 hours.  Lab Results  Component Value Date   SARSCOV2NAA NEGATIVE 01/17/2020     Scheduled medications:   . influenza vac split quadrivalent PF  0.5 mL Intramuscular Tomorrow-1000  . insulin aspart  0-20 Units Subcutaneous TID WC  . insulin aspart  0-5 Units Subcutaneous QHS  . insulin aspart  5 Units Subcutaneous TID WC  . insulin glargine  25 Units Subcutaneous QHS  . losartan  50 mg Oral Daily  . pneumococcal 23 valent vaccine  0.5 mL Intramuscular Tomorrow-1000    CBG: Recent Labs  Lab 01/21/20 1113 01/21/20 1818 01/21/20 2050 01/22/20 0744 01/22/20 1119  GLUCAP 179* 157* 185* 141* 120*    SpO2: 99 %    CBC: Recent Labs  Lab 01/17/20 1515 01/17/20 1515 01/17/20 2209 01/18/20 0500 01/18/20 0912 01/18/20 2043 01/19/20 1004  WBC 7.7  --  6.5 6.6  --   --  8.5  NEUTROABS 6.5  --   --  5.4  --   --  7.0  HGB 11.1*  --  11.0* 10.9*  --   --  12.3*  HCT 33.3*  --  32.8* 32.6*  --   --  36.5*  MCV 90.7  --  91.1 91.1  --   --  89.9  PLT PLATELET CLUMPS NOTED ON SMEAR, UNABLE TO ESTIMATE   < > PLATELET CLUMPS NOTED ON SMEAR, UNABLE TO ESTIMATE 58* 55* 76* 61*   < > = values in this interval not displayed.    Basic Metabolic Panel: Recent Labs  Lab 01/17/20 1515 01/17/20 1515  01/17/20 2209 01/18/20 0500 01/18/20 2043 01/19/20 1004 01/19/20 1742  NA 123*  --   --  127* 127* 128* 129*  K 4.2  --   --  3.8 3.9 3.8 4.2  CL 93*  --   --  98 98 99 101  CO2 16*  --   --  20* 19* 19* 21*  GLUCOSE 377*  --   --  254* 231* 182* 202*  BUN 20  --   --  16 17 17 19   CREATININE 1.04   < > 1.02 0.90 0.87 0.90 0.80  CALCIUM 7.4*  --   --  7.5* 7.6* 7.6* 7.5*   < > = values in this interval not displayed.     Liver Function Tests: Recent Labs  Lab 01/17/20 1515 01/18/20 0500 01/19/20 1004  AST 24 20 34  ALT 14 14 16   ALKPHOS 99 77 82  BILITOT 3.4* 3.0* 2.2*  PROT 5.6* 5.5* 5.7*  ALBUMIN 2.0* 1.9* 1.9*     Antibiotics: Anti-infectives (From admission, onward)   Start     Dose/Rate Route Frequency Ordered Stop   01/21/20 1800  ceFAZolin (ANCEF) IVPB 1 g/50 mL premix        1 g 100 mL/hr over 30 Minutes Intravenous Every 8 hours 01/21/20 1636     01/18/20 2200  vancomycin (VANCOREADY) IVPB 1250 mg/250 mL  Status:  Discontinued        1,250 mg 166.7 mL/hr over 90 Minutes Intravenous Every 12 hours 01/18/20 0846 01/21/20 1636   01/18/20 1600  ceFEPIme (MAXIPIME) 2 g in sodium chloride 0.9 % 100 mL IVPB  Status:  Discontinued        2 g 200 mL/hr over 30 Minutes Intravenous Every 8 hours 01/18/20 0842 01/21/20 1636   01/18/20 0630  metroNIDAZOLE (FLAGYL) IVPB 500 mg  Status:  Discontinued        500 mg 100 mL/hr over 60 Minutes Intravenous Every 8 hours 01/18/20 0613 01/21/20 1636   01/18/20 0000  ceFEPIme (MAXIPIME) 1 g in sodium chloride 0.9 % 100 mL IVPB  Status:  Discontinued        1 g 200 mL/hr over 30 Minutes Intravenous Every 8 hours 01/17/20 1735 01/18/20 0842   01/18/20 0000  vancomycin (VANCOREADY) IVPB 1250 mg/250 mL  Status:  Discontinued        1,250 mg 166.7 mL/hr over 90 Minutes Intravenous Every 8 hours 01/17/20 1735 01/18/20 0846   01/17/20 1530  vancomycin (VANCOREADY) IVPB 2000 mg/400 mL        2,000 mg 200 mL/hr over 120 Minutes  Intravenous  Once 01/17/20 1444 01/17/20 1724   01/17/20 1445  ceFEPIme (MAXIPIME) 2 g in sodium chloride 0.9 % 100 mL IVPB        2 g 200 mL/hr over 30 Minutes Intravenous  Once 01/17/20 1442 01/17/20 1513   01/17/20 1445  metroNIDAZOLE (FLAGYL) IVPB 500  mg        500 mg 100 mL/hr over 60 Minutes Intravenous  Once 01/17/20 1442 01/17/20 1623   01/17/20 1445  vancomycin (VANCOCIN) IVPB 1000 mg/200 mL premix  Status:  Discontinued        1,000 mg 200 mL/hr over 60 Minutes Intravenous  Once 01/17/20 1442 01/17/20 1444       DVT prophylaxis: Heparin  Code Status: Full code  Family Communication: No family at bedside    Status is: Inpatient  Dispo: The patient is from: Home              Anticipated d/c is to: Skilled nursing facility              Anticipated d/c date is: 01/24/2020              Patient currently not medically stable for discharge  Barrier to discharge-ongoing IV antibiotics for right lower extremity cellulitis, scrotal cellulitis     Consultants:  None  Procedures:  None   Objective   Vitals:   01/22/20 0200 01/22/20 0618 01/22/20 0745 01/22/20 1120  BP:  (!) 149/68 (!) 133/59 136/64  Pulse:  78 76 74  Resp:  17 20 (!) 21  Temp:  99 F (37.2 C) 98.2 F (36.8 C) 98.1 F (36.7 C)  TempSrc:  Oral Oral Oral  SpO2:  95% 99% 99%  Weight: 131 kg 129.7 kg    Height:        Intake/Output Summary (Last 24 hours) at 01/22/2020 1309 Last data filed at 01/22/2020 1100 Gross per 24 hour  Intake 1466.3 ml  Output 1250 ml  Net 216.3 ml    09/16 1901 - 09/18 0700 In: 3021.9 [P.O.:840; I.V.:1441.7] Out: 1600 [Urine:1600]  Filed Weights   01/20/20 0629 01/22/20 0200 01/22/20 0618  Weight: 129.7 kg 131 kg 129.7 kg    Physical Examination:   General-appears in no acute distress  Heart-S1-S2, regular, no murmur auscultated  Lungs-clear to auscultation bilaterally, no wheezing or crackles auscultated  Abdomen-soft, nontender, no  organomegaly  Extremities-mild erythema, edema has improved in right posterior thigh and scrotum.  Nontender to palpation  Neuro-alert, oriented x3, no focal deficit noted    Data Reviewed:   Recent Results (from the past 240 hour(s))  Blood Culture (routine x 2)     Status: None (Preliminary result)   Collection Time: 01/17/20  2:30 PM   Specimen: BLOOD  Result Value Ref Range Status   Specimen Description BLOOD LEFT ANTECUBITAL  Final   Special Requests   Final    BOTTLES DRAWN AEROBIC AND ANAEROBIC Blood Culture results may not be optimal due to an inadequate volume of blood received in culture bottles   Culture   Final    NO GROWTH 4 DAYS Performed at [facility], [address] [place], [place] [address]    Report Status PENDING  Incomplete  Blood Culture (routine x 2)     Status: None (Preliminary result)   Collection Time: 01/17/20  2:30 PM   Specimen: BLOOD  Result Value Ref Range Status   Specimen Description BLOOD RIGHT ANTECUBITAL  Final   Special Requests   Final    BOTTLES DRAWN AEROBIC AND ANAEROBIC Blood Culture adequate volume   Culture   Final    NO GROWTH 4 DAYS Performed at [facility], [address] [place], [place] [address]    Report Status PENDING  Incomplete  SARS Coronavirus 2 by RT  PCR (hospital order, performed in [facility] hospital lab) Nasopharyngeal Nasopharyngeal Swab     Status: None   Collection Time: 01/17/20  3:17 PM   Specimen: Nasopharyngeal Swab  Result Value Ref Range Status   SARS Coronavirus 2 NEGATIVE NEGATIVE Final    Comment: (NOTE) SARS-CoV-2 target nucleic acids are NOT DETECTED.  The SARS-CoV-2 RNA is generally detectable in upper and lower respiratory specimens during the acute phase of infection. The lowest concentration of SARS-CoV-2 viral copies this assay can detect is 250 copies / mL. A negative result does not preclude SARS-CoV-2 infection and should not be used as the sole basis for treatment or  other patient management decisions.  A negative result may occur with improper specimen collection / handling, submission of specimen other than nasopharyngeal swab, presence of viral mutation(s) within the areas targeted by this assay, and inadequate number of viral copies (<250 copies / mL). A negative result must be combined with clinical observations, patient history, and epidemiological information.  Fact Sheet for Patients:   BoilerBrush.com.cy  Fact Sheet for Healthcare Providers: https://pope.com/  This test is not yet approved or  cleared by the [place] FDA and has been authorized for detection and/or diagnosis of SARS-CoV-2 by FDA under an Emergency Use Authorization (EUA).  This EUA will remain in effect (meaning this test can be used) for the duration of the COVID-19 declaration under Section 564(b)(1) of the Act, 21 U.S.C. section 360bbb-3(b)(1), unless the authorization is terminated or revoked sooner.  Performed at [facility], [address] [place], [place] [address]   Urine culture     Status: Abnormal   Collection Time: 01/17/20  4:16 PM   Specimen: In/Out Cath Urine  Result Value Ref Range Status   Specimen Description IN/OUT CATH URINE  Final   Special Requests NONE  Final   Culture (A)  Final    30,000 COLONIES/mL GROUP B STREP(S.AGALACTIAE)ISOLATED TESTING AGAINST S. AGALACTIAE NOT ROUTINELY PERFORMED DUE TO PREDICTABILITY OF AMP/PEN/VAN SUSCEPTIBILITY. Performed at [facility], [address], [place], [place] [address]    Report Status 01/18/2020 FINAL  Final    No results for input(s): LIPASE, AMYLASE in the last 168 hours. No results for input(s): AMMONIA in the last 168 hours.  Cardiac Enzymes: No results for input(s): CKTOTAL, CKMB, CKMBINDEX, TROPONINI in the last 168 hours. BNP (last 3 results) Recent Labs    01/18/20 1212  BNP 648.6*     Studies:  US  SCROTUM  Result Date: 01/21/2020 CLINICAL DATA:  Scrotal swelling since this morning EXAM: ULTRASOUND OF SCROTUM TECHNIQUE: Complete ultrasound examination of the testicles, epididymis, and other scrotal structures was performed. COMPARISON:  None. FINDINGS: Right testicle Measurements: 5.8 x 3.6 x 3.6 cm. Uniform parenchymal echogenicity. No mass or microlithiasis. Left testicle Not visualized. Right epididymis: Suboptimal visualization of the right epididymis. Question some mild heterogeneity seen on sagittal imaging of the right testicle (16/28). Left epididymis:  Not visualized Hydrocele: Slight increase in the amount of normal physiologic paratesticular fluid on the right. Varicocele:  None visualized. Other: Diffuse scrotal skin thickening and edematous changes. IMPRESSION: Absence of the left testicle. Correlate for surgical history or congenital absence. Suboptimal visualization of the right epididymis. Questionable heterogeneity seen on sagittal imaging of the testicle could reflect an epididymitis. No convincing features of right orchitis. Trace right hydrocele, likely reactive. Diffuse scrotal skin thickening and edematous changes. Correlate for features of cellulitis. No organized abscess or collection. Electronically Signed   By:  Kreg Shropshire M.D.   On: 01/21/2020 17:41       Elidia Bonenfant S Patrich Heinze   [facility] If 7PM-7AM, please contact night-coverage at [email or web address], Office  [phone]   01/22/2020, 1:09 PM  LOS: 5 days

## 2020-01-22 NOTE — Progress Notes (Signed)
Subjective/Chief Complaint:   1- Scrotal Cellulitis - few days of RLE swelling, erythema, pain c/w cellulitis prior to admission. Now on Vanc + Zosyn and some progression to scrotal area. Exam and [place] w/o abscess / crepitus. No voiding complaints. H/o diabetes.  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 He has a left inguinal scar that he does not know history of from childood. No recetn abd imaging.   Today "Dennis Green" is stable. No high grade fevers. BCX remain negative. No leukocytosis.    Objective: Vital signs in last 24 hours: Temp:  [97.9 F (36.6 C)-100.7 F (38.2 C)] 99 F (37.2 C) (09/18 0618) Pulse Rate:  [66-87] 78 (09/18 0618) Resp:  [17-20] 17 (09/18 0618) BP: (122-156)/(55-72) 149/68 (09/18 0618) SpO2:  [94 %-97 %] 95 % (09/18 0618) Weight:  [129.7 kg-131 kg] 129.7 kg (09/18 0618) Last BM Date: 01/22/20  Intake/Output from previous day: 09/17 0701 - 09/18 0700 In: 964.1 [P.O.:720; IV Piggyback:244.1] Out: 1100 [Urine:1100] Intake/Output this shift: No intake/output data recorded.  Physical Exam HENT:     Head: Normocephalic.     Nose: Nose normal.     Mouth/Throat:     Mouth: Mucous membranes are moist.  Cardiovascular:     Rate and Rhythm: Normal rate.     Pulses: Normal pulses.  Pulmonary:     Effort: Pulmonary effort is normal.  Abdominal:     Comments: Moderate obesity. NO CVAT.   Genitourinary:    Penis: Normal.      Comments: Soft scrotal wall edema and non-beefy overlying erythema c/w cellulits. No crepitus / fluctuence / necrosis. Less erythema. Stable edema.  Musculoskeletal:     Cervical back: Normal range of motion.     Comments: RLE warmth and erythema of skin c/w cellulitis. No necrotis / crepitus / fluctuence.   Skin:    General: Skin is warm.  Neurological:     General: No focal deficit present.     Mental Status: He is alert.  Psychiatric:        Mood and Affect: Mood normal.   Lab  Results:  Recent Labs    01/19/20 1004  WBC 8.5  HGB 12.3*  HCT 36.5*  PLT 61*   BMET Recent Labs    01/19/20 1004 01/19/20 1742  NA 128* 129*  K 3.8 4.2  CL 99 101  CO2 19* 21*  GLUCOSE 182* 202*  BUN 17 19  CREATININE 0.90 0.80  CALCIUM 7.6* 7.5*   PT/INR No results for input(s): LABPROT, INR in the last 72 hours. ABG No results for input(s): PHART, HCO3 in the last 72 hours.  Invalid input(s): PCO2, PO2  Studies/Results: US SCROTUM  Result Date: 01/21/2020 CLINICAL DATA:  Scrotal swelling since this morning EXAM: ULTRASOUND OF SCROTUM TECHNIQUE: Complete ultrasound examination of the testicles, epididymis, and other scrotal structures was performed. COMPARISON:  None. FINDINGS: Right testicle Measurements: 5.8 x 3.6 x 3.6 cm. Uniform parenchymal echogenicity. No mass or microlithiasis. Left testicle Not visualized. Right epididymis: Suboptimal visualization of the right epididymis. Question some mild heterogeneity seen on sagittal imaging of the right testicle (16/28). Left epididymis:  Not visualized Hydrocele: Slight increase in the amount of normal physiologic paratesticular fluid on the right. Varicocele:  None visualized. Other: Diffuse scrotal skin thickening and edematous changes. IMPRESSION: Absence of the left testicle. Correlate for surgical history or congenital absence. Suboptimal visualization of the right epididymis. Questionable heterogeneity seen on sagittal imaging of the testicle  could reflect an epididymitis. No convincing features of right orchitis. Trace right hydrocele, likely reactive. Diffuse scrotal skin thickening and edematous changes. Correlate for features of cellulitis. No organized abscess or collection. Electronically Signed   By: Kreg Shropshire M.D.   On: 01/21/2020 17:41    Anti-infectives: Anti-infectives (From admission, onward)   Start     Dose/Rate Route Frequency Ordered Stop   01/21/20 1800  ceFAZolin (ANCEF) IVPB 1 g/50 mL premix         1 g 100 mL/hr over 30 Minutes Intravenous Every 8 hours 01/21/20 1636     01/18/20 2200  vancomycin (VANCOREADY) IVPB 1250 mg/250 mL  Status:  Discontinued        1,250 mg 166.7 mL/hr over 90 Minutes Intravenous Every 12 hours 01/18/20 0846 01/21/20 1636   01/18/20 1600  ceFEPIme (MAXIPIME) 2 g in sodium chloride 0.9 % 100 mL IVPB  Status:  Discontinued        2 g 200 mL/hr over 30 Minutes Intravenous Every 8 hours 01/18/20 0842 01/21/20 1636   01/18/20 0630  metroNIDAZOLE (FLAGYL) IVPB 500 mg  Status:  Discontinued        500 mg 100 mL/hr over 60 Minutes Intravenous Every 8 hours 01/18/20 0613 01/21/20 1636   01/18/20 0000  ceFEPIme (MAXIPIME) 1 g in sodium chloride 0.9 % 100 mL IVPB  Status:  Discontinued        1 g 200 mL/hr over 30 Minutes Intravenous Every 8 hours 01/17/20 1735 01/18/20 0842   01/18/20 0000  vancomycin (VANCOREADY) IVPB 1250 mg/250 mL  Status:  Discontinued        1,250 mg 166.7 mL/hr over 90 Minutes Intravenous Every 8 hours 01/17/20 1735 01/18/20 0846   01/17/20 1530  vancomycin (VANCOREADY) IVPB 2000 mg/400 mL        2,000 mg 200 mL/hr over 120 Minutes Intravenous  Once 01/17/20 1444 01/17/20 1724   01/17/20 1445  ceFEPIme (MAXIPIME) 2 g in sodium chloride 0.9 % 100 mL IVPB        2 g 200 mL/hr over 30 Minutes Intravenous  Once 01/17/20 1442 01/17/20 1513   01/17/20 1445  metroNIDAZOLE (FLAGYL) IVPB 500 mg        500 mg 100 mL/hr over 60 Minutes Intravenous  Once 01/17/20 1442 01/17/20 1623   01/17/20 1445  vancomycin (VANCOCIN) IVPB 1000 mg/200 mL premix  Status:  Discontinued        1,000 mg 200 mL/hr over 60 Minutes Intravenous  Once 01/17/20 1442 01/17/20 1444      Assessment/Plan:  1- Scrotal Cellulitis - fortunately this appears mostly just extension of leg cellulits. NO necrotizing infections / drainagble collections. Agree with current ABX pending additional data. Most of his scrotal swelling is reactive edema and will slowly resolve as infection  clears.   Will follow to r/o progression to organized abscesses. This does not appear to be occurring. The edema component will be lagging indicator and may take weeks to completely resolve.   2 - Right Solitary Testicle - unclear if left testi in place of likely resolved / nubbin with prior orchidopexy given his left inguinal scar. No indication for intervention at this point. Conssider abd/pelvis imagign in elective setting to r/o post-pubertal intra-abdominal testis.    Sebastian Ache 01/22/2020

## 2020-01-22 NOTE — Progress Notes (Signed)
Report given to [facility], Charity fundraiser. Patient is alert and oriented x4 sitting up in bed with call light in reach. SR on tele. ACHS. IVF infusing along with intermittent antibiotics.  Bed offer made for [facility], awaiting insurance approval. OT to see patient today. ID may change IV antibiotics to PO Keflex QID. No labs this AM. PRN pain medication given x1 overnight.

## 2020-01-23 LAB — CBC
HCT: 33.4 % — ABNORMAL LOW (ref 39.0–52.0)
Hemoglobin: 11.4 g/dL — ABNORMAL LOW (ref 13.0–17.0)
MCH: 31.1 pg (ref 26.0–34.0)
MCHC: 34.1 g/dL (ref 30.0–36.0)
MCV: 91 fL (ref 80.0–100.0)
Platelets: 98 10*3/uL — ABNORMAL LOW (ref 150–400)
RBC: 3.67 MIL/uL — ABNORMAL LOW (ref 4.22–5.81)
RDW: 13.3 % (ref 11.5–15.5)
WBC: 5.4 10*3/uL (ref 4.0–10.5)
nRBC: 0 % (ref 0.0–0.2)

## 2020-01-23 LAB — GLUCOSE, CAPILLARY
Glucose-Capillary: 119 mg/dL — ABNORMAL HIGH (ref 70–99)
Glucose-Capillary: 125 mg/dL — ABNORMAL HIGH (ref 70–99)
Glucose-Capillary: 109 mg/dL — ABNORMAL HIGH (ref 70–99)
Glucose-Capillary: 133 mg/dL — ABNORMAL HIGH (ref 70–99)

## 2020-01-23 LAB — BASIC METABOLIC PANEL
Anion gap: 7 (ref 5–15)
BUN: 10 mg/dL (ref 6–20)
CO2: 19 mmol/L — ABNORMAL LOW (ref 22–32)
Calcium: 7 mg/dL — ABNORMAL LOW (ref 8.9–10.3)
Chloride: 102 mmol/L (ref 98–111)
Creatinine, Ser: 0.7 mg/dL (ref 0.61–1.24)
GFR calc Af Amer: >60 mL/min (ref >60)
GFR calc non Af Amer: >60 mL/min (ref >60)
Glucose, Bld: 146 mg/dL — ABNORMAL HIGH (ref 70–99)
Potassium: 3.5 mmol/L (ref 3.5–5.1)
Sodium: 128 mmol/L — ABNORMAL LOW (ref 135–145)

## 2020-01-23 LAB — ANTI-SMOOTH MUSCLE ANTIBODY, IGG: F-Actin IgG: 9 Units (ref 0–19)

## 2020-01-23 LAB — MAGNESIUM: Magnesium: 1.7 mg/dL (ref 1.7–2.4)

## 2020-01-23 MED ORDER — SACCHAROMYCES BOULARDII 250 MG PO CAPS
250.0000 mg | ORAL_CAPSULE | Freq: Two times a day (BID) | ORAL | Status: DC
  Administered 2020-01-23 – 2020-01-26 (×7): 250 mg via ORAL
  Filled 2020-01-23 (×7): qty 1

## 2020-01-23 MED ORDER — POTASSIUM CHLORIDE CRYS ER 20 MEQ PO TBCR
20.0000 meq | EXTENDED_RELEASE_TABLET | Freq: Once | ORAL | Status: AC
  Administered 2020-01-23: 20 meq via ORAL
  Filled 2020-01-23: qty 1

## 2020-01-23 NOTE — Progress Notes (Signed)
Subjective/Chief Complaint:  1- Scrotal Cellulitis - few days of RLE swelling, erythema, pain c/w cellulitis prior to admission. Now on Vanc + Zosyn and some progression to scrotal area. Exam and [place] w/o abscess / crepitus. No voiding complaints. H/o diabetes.  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 He has a left inguinal scar that he does not know history of from childood. No recetn abd imaging.   Today "Dennis Green" is stable. No high grade fevers. BCX remain negative. No leukocytosis. He is finally feeling improved.     Objective: Vital signs in last 24 hours: Temp:  [97.9 F (36.6 C)-100.2 F (37.9 C)] 98.7 F (37.1 C) (09/19 0733) Pulse Rate:  [74-82] 74 (09/19 0733) Resp:  [18-21] 19 (09/19 0400) BP: (136-152)/(60-65) 150/65 (09/19 0733) SpO2:  [94 %-99 %] 94 % (09/19 0400) Last BM Date: 01/22/20  Intake/Output from previous day: 09/18 0701 - 09/19 0700 In: 2078.5 [P.O.:360; I.V.:1575.8; IV Piggyback:142.7] Out: 710 [Urine:710] Intake/Output this shift: No intake/output data recorded.   Physical Exam HENT:     Head: Normocephalic.     Nose: Nose normal.     Mouth/Throat:     Mouth: Mucous membranes are moist.  Cardiovascular:     Rate and Rhythm: Normal rate.     Pulses: Normal pulses.  Pulmonary:     Effort: Pulmonary effort is normal.  Abdominal:     Comments: Moderate obesity. NO CVAT.   Genitourinary:    Penis: Normal.      Comments: Improved Soft scrotal wall edema and non-beefy overlying erythema c/w cellulits. No crepitus / fluctuence / necrosis. Less erythema. Stable edema.  Musculoskeletal:     Cervical back: Normal range of motion.     Comments: Improved RLE warmth and erythema of skin c/w cellulitis. No necrotis / crepitus / fluctuence.   Skin:    General: Skin is warm.  Neurological:     General: No focal deficit present.     Mental Status: He is alert.  Psychiatric:        Mood and Affect: Mood  normal.   Lab Results:  Recent Labs    01/23/20 0135  WBC 5.4  HGB 11.4*  HCT 33.4*  PLT 98*   BMET Recent Labs    01/23/20 0135  NA 128*  K 3.5  CL 102  CO2 19*  GLUCOSE 146*  BUN 10  CREATININE 0.70  CALCIUM 7.0*   PT/INR No results for input(s): LABPROT, INR in the last 72 hours. ABG No results for input(s): PHART, HCO3 in the last 72 hours.  Invalid input(s): PCO2, PO2  Studies/Results: US SCROTUM  Result Date: 01/21/2020 CLINICAL DATA:  Scrotal swelling since this morning EXAM: ULTRASOUND OF SCROTUM TECHNIQUE: Complete ultrasound examination of the testicles, epididymis, and other scrotal structures was performed. COMPARISON:  None. FINDINGS: Right testicle Measurements: 5.8 x 3.6 x 3.6 cm. Uniform parenchymal echogenicity. No mass or microlithiasis. Left testicle Not visualized. Right epididymis: Suboptimal visualization of the right epididymis. Question some mild heterogeneity seen on sagittal imaging of the right testicle (16/28). Left epididymis:  Not visualized Hydrocele: Slight increase in the amount of normal physiologic paratesticular fluid on the right. Varicocele:  None visualized. Other: Diffuse scrotal skin thickening and edematous changes. IMPRESSION: Absence of the left testicle. Correlate for surgical history or congenital absence. Suboptimal visualization of the right epididymis. Questionable heterogeneity seen on sagittal imaging of the testicle could reflect an epididymitis. No convincing features of right orchitis.  Trace right hydrocele, likely reactive. Diffuse scrotal skin thickening and edematous changes. Correlate for features of cellulitis. No organized abscess or collection. Electronically Signed   By: Kreg Shropshire M.D.   On: 01/21/2020 17:41    Anti-infectives: Anti-infectives (From admission, onward)   Start     Dose/Rate Route Frequency Ordered Stop   01/21/20 1800  ceFAZolin (ANCEF) IVPB 1 g/50 mL premix        1 g 100 mL/hr over 30 Minutes  Intravenous Every 8 hours 01/21/20 1636     01/18/20 2200  vancomycin (VANCOREADY) IVPB 1250 mg/250 mL  Status:  Discontinued        1,250 mg 166.7 mL/hr over 90 Minutes Intravenous Every 12 hours 01/18/20 0846 01/21/20 1636   01/18/20 1600  ceFEPIme (MAXIPIME) 2 g in sodium chloride 0.9 % 100 mL IVPB  Status:  Discontinued        2 g 200 mL/hr over 30 Minutes Intravenous Every 8 hours 01/18/20 0842 01/21/20 1636   01/18/20 0630  metroNIDAZOLE (FLAGYL) IVPB 500 mg  Status:  Discontinued        500 mg 100 mL/hr over 60 Minutes Intravenous Every 8 hours 01/18/20 0613 01/21/20 1636   01/18/20 0000  ceFEPIme (MAXIPIME) 1 g in sodium chloride 0.9 % 100 mL IVPB  Status:  Discontinued        1 g 200 mL/hr over 30 Minutes Intravenous Every 8 hours 01/17/20 1735 01/18/20 0842   01/18/20 0000  vancomycin (VANCOREADY) IVPB 1250 mg/250 mL  Status:  Discontinued        1,250 mg 166.7 mL/hr over 90 Minutes Intravenous Every 8 hours 01/17/20 1735 01/18/20 0846   01/17/20 1530  vancomycin (VANCOREADY) IVPB 2000 mg/400 mL        2,000 mg 200 mL/hr over 120 Minutes Intravenous  Once 01/17/20 1444 01/17/20 1724   01/17/20 1445  ceFEPIme (MAXIPIME) 2 g in sodium chloride 0.9 % 100 mL IVPB        2 g 200 mL/hr over 30 Minutes Intravenous  Once 01/17/20 1442 01/17/20 1513   01/17/20 1445  metroNIDAZOLE (FLAGYL) IVPB 500 mg        500 mg 100 mL/hr over 60 Minutes Intravenous  Once 01/17/20 1442 01/17/20 1623   01/17/20 1445  vancomycin (VANCOCIN) IVPB 1000 mg/200 mL premix  Status:  Discontinued        1,000 mg 200 mL/hr over 60 Minutes Intravenous  Once 01/17/20 1442 01/17/20 1444      Assessment/Plan:  1- Scrotal Cellulitis - fortunately this appears mostly just extension of leg cellulits and is now clearly improving with medial therapy.  NO necrotizing infections / drainagble collections. Agree with current ABX pending additional data. Most of his scrotal swelling is reactive edema and will slowly  resolve as infection clears.   Will follow to r/o progression to organized abscesses. This does not appear to be occurring. The edema component will be lagging indicator and may take weeks to completely resolve.   2 - Right Solitary Testicle - unclear if left testi in place of likely resolved / nubbin with prior orchidopexy given his left inguinal scar. No indication for intervention at this point. Conssider abd/pelvis imagign in elective setting to r/o post-pubertal intra-abdominal testis.    Sebastian Ache 01/23/2020

## 2020-01-23 NOTE — Evaluation (Signed)
Occupational Therapy Evaluation Patient Details Name: Dennis Green MRN: [id number] DOB: 04-26-1966 Today's Date: 01/23/2020    History of Present Illness 54 year old gentleman with prior history of hypertension, type 2 diabetes mellitus reports worsening pain in the right lower extremity over the past 2 weeks also reports bilateral leg edema for over a month.  On arrival to ED he was found to be febrile, elevated lactic acid,  CT of the right leg shows features consistent with diffuse cellulitis of the leg.  Blood cultures were drawn and sepsis protocol was initiated.  Venous duplex was ordered and is negative for acute DVT.   Clinical Impression   PTA, pt was independent and reports he lives outside. Pt currently requiring Mod-Max A for LB ADLs and Min A for functional mobility. Pt presenting with decreased ROM and balance. Educating pt on edema management through movement, compression, and elevation. Fabricating scrotal sling and education pt on sling management, wear schedule, and position; pt verbalized understanding. Pt reports decrease pain with sling. Pt would benefit from further acute OT to facilitate safe dc. Recommend dc to home once medically stable per physician.       Follow Up Recommendations  No OT follow up;Supervision - Intermittent    Equipment Recommendations  None recommended by OT    Recommendations for Other Services PT consult     Precautions / Restrictions Precautions Precautions: Fall Precaution Comments: pt denies falls in past 1 year      Mobility Bed Mobility Overal bed mobility: Modified Independent             General bed mobility comments: deferred due to increased pain with movement   Transfers Overall transfer level: Needs assistance Equipment used: 1 person hand held assist Transfers: Sit to/from Stand;Stand Pivot Transfers Sit to Stand: Min assist         General transfer comment: Min A for single hand held A and maintaing  balance.    Balance Overall balance assessment: Needs assistance Sitting-balance support: No upper extremity supported;Feet supported Sitting balance-Leahy Scale: Good     Standing balance support: No upper extremity supported;During functional activity Standing balance-Leahy Scale: Fair                             ADL either performed or assessed with clinical judgement   ADL Overall ADL's : Needs assistance/impaired Eating/Feeding: Set up;Sitting   Grooming: Set up;Sitting   Upper Body Bathing: Supervision/ safety;Set up;Sitting   Lower Body Bathing: Moderate assistance;Sit to/from stand   Upper Body Dressing : Supervision/safety;Set up;Sitting   Lower Body Dressing: Maximal assistance;Bed level;Sit to/from stand   Toilet Transfer: Minimal assistance;Ambulation (simulated to recliner) Toilet Transfer Details (indicate cue type and reason): Min hand held A for balance         Functional mobility during ADLs: Minimal assistance General ADL Comments: Pt presenting with decreased ROM, balance, and activity tolerance. Providing pt with scrotal sling for edema management.      Vision Baseline Vision/History: Wears glasses Wears Glasses: At all times Patient Visual Report: No change from baseline       Perception     Praxis      Pertinent Vitals/Pain Pain Assessment: Faces Faces Pain Scale: Hurts even more Pain Location: R LE and scrotum  Pain Descriptors / Indicators: Guarding;Grimacing Pain Intervention(s): Monitored during session;Limited activity within patient's tolerance;Repositioned     Hand Dominance Right   Extremity/Trunk Assessment Upper Extremity Assessment Upper Extremity Assessment:  Overall WFL for tasks assessed   Lower Extremity Assessment Lower Extremity Assessment: Defer to PT evaluation;RLE deficits/detail RLE Deficits / Details: pitting edema in ankle/calf, knee ext AROM -15* limited by pain, knee ext +3/5 RLE: Unable to fully  assess due to pain RLE Sensation: WNL RLE Coordination: WNL   Cervical / Trunk Assessment Cervical / Trunk Assessment: Normal   Communication Communication Communication: No difficulties   Cognition Arousal/Alertness: Awake/alert Behavior During Therapy: WFL for tasks assessed/performed Overall Cognitive Status: Within Functional Limits for tasks assessed                                 General Comments: Very agreeable to therapy   General Comments  Fabricating scrotal sling for increased edema. Providing education on edmea management through mobility, compression, and elevation. Pt reporting decreased pain and more comfort with sling. Education pt and RN on management, positioning, and wear schedule (24/7 at pt tolerance). Both verbalized understanding.    Exercises     Shoulder Instructions      Home Living Family/patient expects to be discharged to:: Shelter/Homeless                                 Additional Comments: pt stated he is homeless and lives outside      Prior Functioning/Environment Level of Independence: Independent        Comments: walked with [facility] PTA, denies falls in past 1 year        OT Problem List: Decreased strength;Decreased range of motion;Decreased activity tolerance;Impaired balance (sitting and/or standing);Decreased knowledge of use of DME or AE;Decreased knowledge of precautions;Pain;Increased edema      OT Treatment/Interventions: Self-care/ADL training;Therapeutic exercise;Energy conservation;DME and/or AE instruction;Therapeutic activities;Patient/family education    OT Goals(Current goals can be found in the care plan section) Acute Rehab OT Goals Patient Stated Goal: reduce pain, be able to walk OT Goal Formulation: With patient Time For Goal Achievement: 02/06/20 Potential to Achieve Goals: Good  OT Frequency: Min 2X/week   Barriers to D/C:            Co-evaluation              AM-PAC OT  "6 Clicks" Daily Activity     Outcome Measure Help from another person eating meals?: None Help from another person taking care of personal grooming?: A Little Help from another person toileting, which includes using toliet, bedpan, or urinal?: A Little Help from another person bathing (including washing, rinsing, drying)?: A Lot Help from another person to put on and taking off regular upper body clothing?: A Little Help from another person to put on and taking off regular lower body clothing?: A Lot 6 Click Score: 17   End of Session Equipment Utilized During Treatment: Other (comment) (Scrotal sling) Nurse Communication: Mobility status (sling management)  Activity Tolerance: Patient tolerated treatment well Patient left: in chair;with call bell/phone within reach  OT Visit Diagnosis: Unsteadiness on feet (R26.81);Other abnormalities of gait and mobility (R26.89);Muscle weakness (generalized) (M62.81);Pain Pain - Right/Left: Right Pain - part of body: Leg                Time: 3976-7341 OT Time Calculation (min): 25 min Charges:  OT General Charges $OT Visit: 1 Visit OT Evaluation $OT Eval Moderate Complexity: 1 Mod OT Treatments $Self Care/Home Management : 8-22 mins  Jamisen Hawes MSOT,  OTR/L Acute Rehab Pager: [phone] Office: [phone]  Theodoro Grist Yvon Mccord 01/23/2020, 12:11 PM

## 2020-01-23 NOTE — Progress Notes (Signed)
[facility]  PROGRESS NOTE  Dennis Green BJY:[id number] DOB: 1965/06/23 DOA: 01/17/2020 PCP: Patient, No Pcp Per   Brief HPI:   54 year old male with history of hypertension, diabetes mellitus type 2, presented with worsening pain right lower extremity for past 2 weeks.  Also reports bilateral leg edema for a month.  Denied chest pain or shortness of breath.  In the ED he was found to have elevated lactic acid.  CT of the right leg showed features consistent with diffuse cellulitis of the leg.  Blood cultures were drawn and sepsis protocol was initiated.  Venous duplex of lower extremities was negative for acute DVT.    Subjective   Patient seen and examined, complains of diarrhea.   Assessment/Plan:     1. Sepsis-secondary to cellulitis of right posterior thigh, sepsis physiology has resolved.  Patient was started on vancomycin, Flagyl and cefepime, urine culture grew group B strep.  ID was consulted.  IV antibiotics have been changed to cefazolin as per ID.  Switch to Keflex 500 p.o. 4 times daily at discharge.  Venous duplex of lower extremities negative for DVT. 2. Diarrhea-likely antibiotic induced.  Bystolic Florastor 250 mg p.o. twice daily. 3. Scrotal cellulitis-patient presented with right posterior thigh cellulitis and has been getting antibiotics vancomycin, cefepime, Flagyl.  Redness and swelling has spread to the scrotal area.  Scrotal ultrasound obtained shows 1 testicle, absent left testicle.  Also shows some hydrocele.  Urology was consulted.  No drainable abscess, scrotal cellulitis and swelling has improved. 4. Hypertension-blood pressure is now elevated, restarted Cozaar 100 mg p.o. daily. 5. Diabetes mellitus type 2-hemoglobin A1c was 8.8.  Continue Lantus 35 units subcu daily.  Resistant sliding scale.  NovoLog 5 units 3 times daily with meals.  Blood glucose is fairly well controlled. 6. Anemia of chronic disease/normocytic anemia-hemoglobin stable at  11. 7. Liver cirrhosis /thrombocytopenia-platelet count 61,000 today.  Abdominal ultrasound shows cirrhotic liver.  Patient denies heavy alcohol use in the past.  No previous history of hepatitis.  Will check hepatitis panel.  Will need GI evaluation as outpatient.  No signs of hepatic decompensation at this time.  LFTs are normal.patient has elevated thrombin time, hypoalbuminemia, thrombocytopenia, abdominal ultrasound shows cirrhotic liver.  Unclear etiology, called [facility], appointment has been set up as outpatient.  Work-up for liver cirrhosis has been started.  Labs drawn in the hospital.  Patient to follow-up with GI as outpatient. 8. Hyponatremia-sodium 129, likely from underlying liver cirrhosis.   COVID-19 Labs  No results for input(s): DDIMER, FERRITIN, LDH, CRP in the last 72 hours.  Lab Results  Component Value Date   SARSCOV2NAA NEGATIVE 01/17/2020     Scheduled medications:   . influenza vac split quadrivalent PF  0.5 mL Intramuscular Tomorrow-1000  . insulin aspart  0-20 Units Subcutaneous TID WC  . insulin aspart  0-5 Units Subcutaneous QHS  . insulin aspart  5 Units Subcutaneous TID WC  . insulin glargine  25 Units Subcutaneous QHS  . losartan  50 mg Oral Daily  . pneumococcal 23 valent vaccine  0.5 mL Intramuscular Tomorrow-1000  . saccharomyces boulardii  250 mg Oral BID    CBG: Recent Labs  Lab 01/22/20 0744 01/22/20 1119 01/22/20 1635 01/22/20 2036 01/23/20 0727  GLUCAP 141* 120* 143* 157* 119*    SpO2: 94 %    CBC: Recent Labs  Lab 01/17/20 1515 01/17/20 1515 01/17/20 2209 01/17/20 2209 01/18/20 0500 01/18/20 0912 01/18/20 2043 01/19/20 1004 01/23/20 0135  WBC 7.7  --  6.5  --  6.6  --   --  8.5 5.4  NEUTROABS 6.5  --   --   --  5.4  --   --  7.0  --   HGB 11.1*  --  11.0*  --  10.9*  --   --  12.3* 11.4*  HCT 33.3*  --  32.8*  --  32.6*  --   --  36.5* 33.4*  MCV 90.7  --  91.1  --  91.1  --   --  89.9 91.0  PLT PLATELET CLUMPS NOTED ON  SMEAR, UNABLE TO ESTIMATE   < > PLATELET CLUMPS NOTED ON SMEAR, UNABLE TO ESTIMATE   < > 58* 55* 76* 61* 98*   < > = values in this interval not displayed.    Basic Metabolic Panel: Recent Labs  Lab 01/18/20 0500 01/18/20 2043 01/19/20 1004 01/19/20 1742 01/23/20 0135  NA 127* 127* 128* 129* 128*  K 3.8 3.9 3.8 4.2 3.5  CL 98 98 99 101 102  CO2 20* 19* 19* 21* 19*  GLUCOSE 254* 231* 182* 202* 146*  BUN 16 17 17 19 10   CREATININE 0.90 0.87 0.90 0.80 0.70  CALCIUM 7.5* 7.6* 7.6* 7.5* 7.0*     Liver Function Tests: Recent Labs  Lab 01/17/20 1515 01/18/20 0500 01/19/20 1004  AST 24 20 34  ALT 14 14 16   ALKPHOS 99 77 82  BILITOT 3.4* 3.0* 2.2*  PROT 5.6* 5.5* 5.7*  ALBUMIN 2.0* 1.9* 1.9*     Antibiotics: Anti-infectives (From admission, onward)   Start     Dose/Rate Route Frequency Ordered Stop   01/21/20 1800  ceFAZolin (ANCEF) IVPB 1 g/50 mL premix        1 g 100 mL/hr over 30 Minutes Intravenous Every 8 hours 01/21/20 1636     01/18/20 2200  vancomycin (VANCOREADY) IVPB 1250 mg/250 mL  Status:  Discontinued        1,250 mg 166.7 mL/hr over 90 Minutes Intravenous Every 12 hours 01/18/20 0846 01/21/20 1636   01/18/20 1600  ceFEPIme (MAXIPIME) 2 g in sodium chloride 0.9 % 100 mL IVPB  Status:  Discontinued        2 g 200 mL/hr over 30 Minutes Intravenous Every 8 hours 01/18/20 0842 01/21/20 1636   01/18/20 0630  metroNIDAZOLE (FLAGYL) IVPB 500 mg  Status:  Discontinued        500 mg 100 mL/hr over 60 Minutes Intravenous Every 8 hours 01/18/20 0613 01/21/20 1636   01/18/20 0000  ceFEPIme (MAXIPIME) 1 g in sodium chloride 0.9 % 100 mL IVPB  Status:  Discontinued        1 g 200 mL/hr over 30 Minutes Intravenous Every 8 hours 01/17/20 1735 01/18/20 0842   01/18/20 0000  vancomycin (VANCOREADY) IVPB 1250 mg/250 mL  Status:  Discontinued        1,250 mg 166.7 mL/hr over 90 Minutes Intravenous Every 8 hours 01/17/20 1735 01/18/20 0846   01/17/20 1530  vancomycin  (VANCOREADY) IVPB 2000 mg/400 mL        2,000 mg 200 mL/hr over 120 Minutes Intravenous  Once 01/17/20 1444 01/17/20 1724   01/17/20 1445  ceFEPIme (MAXIPIME) 2 g in sodium chloride 0.9 % 100 mL IVPB        2 g 200 mL/hr over 30 Minutes Intravenous  Once 01/17/20 1442 01/17/20 1513   01/17/20 1445  metroNIDAZOLE (FLAGYL) IVPB 500 mg        500 mg  100 mL/hr over 60 Minutes Intravenous  Once 01/17/20 1442 01/17/20 1623   01/17/20 1445  vancomycin (VANCOCIN) IVPB 1000 mg/200 mL premix  Status:  Discontinued        1,000 mg 200 mL/hr over 60 Minutes Intravenous  Once 01/17/20 1442 01/17/20 1444       DVT prophylaxis: Heparin  Code Status: Full code  Family Communication: No family at bedside    Status is: Inpatient  Dispo: The patient is from: Home              Anticipated d/c is to: Skilled nursing facility              Anticipated d/c date is: 01/24/2020              Patient currently not medically stable for discharge  Barrier to discharge-ongoing IV antibiotics for right lower extremity cellulitis, scrotal cellulitis     Consultants:  None  Procedures:  None   Objective   Vitals:   01/22/20 2031 01/22/20 2043 01/23/20 0400 01/23/20 0733  BP: (!) 147/60  139/61 (!) 150/65  Pulse: 82  74 74  Resp: 20  19   Temp: 100.2 F (37.9 C)  99.8 F (37.7 C) 98.7 F (37.1 C)  TempSrc: Oral  Oral Oral  SpO2: 95% 95% 94%   Weight:      Height:        Intake/Output Summary (Last 24 hours) at 01/23/2020 1057 Last data filed at 01/23/2020 0656 Gross per 24 hour  Intake 1718.53 ml  Output 710 ml  Net 1008.53 ml    09/17 1901 - 09/19 0700 In: 3184.8 [P.O.:600; I.V.:2342.5] Out: 1460 [Urine:1460]  Filed Weights   01/20/20 0629 01/22/20 0200 01/22/20 0618  Weight: 129.7 kg 131 kg 129.7 kg    Physical Examination:   General-appears in no acute distress  Heart-S1-S2, regular, no murmur auscultated  Lungs-clear to auscultation bilaterally, no wheezing or  crackles auscultated  Abdomen-soft, nontender, no organomegaly  Extremities-no edema in the lower extremities  Neuro-alert, oriented x3, no focal deficit noted  Skin-mild edema in the right posterior thigh, very dusky erythema.  Nontender to palpation    Data Reviewed:   Recent Results (from the past 240 hour(s))  Blood Culture (routine x 2)     Status: None   Collection Time: 01/17/20  2:30 PM   Specimen: BLOOD  Result Value Ref Range Status   Specimen Description BLOOD LEFT ANTECUBITAL  Final   Special Requests   Final    BOTTLES DRAWN AEROBIC AND ANAEROBIC Blood Culture results may not be optimal due to an inadequate volume of blood received in culture bottles   Culture   Final    NO GROWTH 5 DAYS Performed at [facility], [address] [place], [place] [address]    Report Status 01/22/2020 FINAL  Final  Blood Culture (routine x 2)     Status: None   Collection Time: 01/17/20  2:30 PM   Specimen: BLOOD  Result Value Ref Range Status   Specimen Description BLOOD RIGHT ANTECUBITAL  Final   Special Requests   Final    BOTTLES DRAWN AEROBIC AND ANAEROBIC Blood Culture adequate volume   Culture   Final    NO GROWTH 5 DAYS Performed at [facility], [address] [place], [place] [address]    Report Status 01/22/2020 FINAL  Final  SARS Coronavirus 2 by RT PCR (hospital order, performed in [facility] hospital lab)  Nasopharyngeal Nasopharyngeal Swab     Status: None   Collection Time: 01/17/20  3:17 PM   Specimen: Nasopharyngeal Swab  Result Value Ref Range Status   SARS Coronavirus 2 NEGATIVE NEGATIVE Final    Comment: (NOTE) SARS-CoV-2 target nucleic acids are NOT DETECTED.  The SARS-CoV-2 RNA is generally detectable in upper and lower respiratory specimens during the acute phase of infection. The lowest concentration of SARS-CoV-2 viral copies this assay can detect is 250 copies / mL. A negative result does not preclude SARS-CoV-2 infection and  should not be used as the sole basis for treatment or other patient management decisions.  A negative result may occur with improper specimen collection / handling, submission of specimen other than nasopharyngeal swab, presence of viral mutation(s) within the areas targeted by this assay, and inadequate number of viral copies (<250 copies / mL). A negative result must be combined with clinical observations, patient history, and epidemiological information.  Fact Sheet for Patients:   BoilerBrush.com.cy  Fact Sheet for Healthcare Providers: https://pope.com/  This test is not yet approved or  cleared by the [place] FDA and has been authorized for detection and/or diagnosis of SARS-CoV-2 by FDA under an Emergency Use Authorization (EUA).  This EUA will remain in effect (meaning this test can be used) for the duration of the COVID-19 declaration under Section 564(b)(1) of the Act, 21 U.S.C. section 360bbb-3(b)(1), unless the authorization is terminated or revoked sooner.  Performed at [facility], [address], [place], [place] [address]   Urine culture     Status: Abnormal   Collection Time: 01/17/20  4:16 PM   Specimen: In/Out Cath Urine  Result Value Ref Range Status   Specimen Description IN/OUT CATH URINE  Final   Special Requests NONE  Final   Culture (A)  Final    30,000 COLONIES/mL GROUP B STREP(S.AGALACTIAE)ISOLATED TESTING AGAINST S. AGALACTIAE NOT ROUTINELY PERFORMED DUE TO PREDICTABILITY OF AMP/PEN/VAN SUSCEPTIBILITY. Performed at [facility], [address] [place], [place] [address]    Report Status 01/18/2020 FINAL  Final    No results for input(s): LIPASE, AMYLASE in the last 168 hours. No results for input(s): AMMONIA in the last 168 hours.  Cardiac Enzymes: No results for input(s): CKTOTAL, CKMB, CKMBINDEX, TROPONINI in the last 168 hours. BNP (last 3 results) Recent Labs     01/18/20 1212  BNP 648.6*     Studies:  US SCROTUM  Result Date: 01/21/2020 CLINICAL DATA:  Scrotal swelling since this morning EXAM: ULTRASOUND OF SCROTUM TECHNIQUE: Complete ultrasound examination of the testicles, epididymis, and other scrotal structures was performed. COMPARISON:  None. FINDINGS: Right testicle Measurements: 5.8 x 3.6 x 3.6 cm. Uniform parenchymal echogenicity. No mass or microlithiasis. Left testicle Not visualized. Right epididymis: Suboptimal visualization of the right epididymis. Question some mild heterogeneity seen on sagittal imaging of the right testicle (16/28). Left epididymis:  Not visualized Hydrocele: Slight increase in the amount of normal physiologic paratesticular fluid on the right. Varicocele:  None visualized. Other: Diffuse scrotal skin thickening and edematous changes. IMPRESSION: Absence of the left testicle. Correlate for surgical history or congenital absence. Suboptimal visualization of the right epididymis. Questionable heterogeneity seen on sagittal imaging of the testicle could reflect an epididymitis. No convincing features of right orchitis. Trace right hydrocele, likely reactive. Diffuse scrotal skin thickening and edematous changes. Correlate for features of cellulitis. No organized abscess or collection. Electronically Signed   By: Kreg Shropshire M.D.   On: 01/21/2020 17:41  Meredeth Ide   [facility] If 7PM-7AM, please contact night-coverage at [email or web address], Office  [phone]   01/23/2020, 10:57 AM  LOS: 6 days

## 2020-01-23 NOTE — Progress Notes (Signed)
Report given to [facility], Charity fundraiser. Patient is alert and oriented x4 resting in bed with call light in reach. VSS. PRN pain medication given x2 overnight. Scrotum elevated on towel at this time and patient states mild relief. Still awaiting OT to come with sling.  IV antibiotics in place for cellulitis, will soon transition to PO Keflex QID. Bed offer made and patient's insurance is pending for acceptance. PT recommends rolling walker at DC, patient's cane currently at bedside. Hgb 11.4, platelets 98, Na+ 128, K+ 3.5, creat 0.70 this AM.

## 2020-01-24 LAB — GLUCOSE, CAPILLARY
Glucose-Capillary: 86 mg/dL (ref 70–99)
Glucose-Capillary: 113 mg/dL — ABNORMAL HIGH (ref 70–99)
Glucose-Capillary: 208 mg/dL — ABNORMAL HIGH (ref 70–99)
Glucose-Capillary: 159 mg/dL — ABNORMAL HIGH (ref 70–99)

## 2020-01-24 MED ORDER — FUROSEMIDE 10 MG/ML IJ SOLN
40.0000 mg | Freq: Once | INTRAMUSCULAR | Status: AC
  Administered 2020-01-24: 40 mg via INTRAVENOUS
  Filled 2020-01-24: qty 4

## 2020-01-24 NOTE — Progress Notes (Signed)
Patient ID: Dennis Green, male   DOB: 1966-02-25, 54 y.o.   MRN: [id number]         [facility]  Date of Admission:  01/17/2020   Total days of antibiotics 8        Day 4 cefazolin         ASSESSMENT: His cellulitis has resolved.  PLAN: 1. Discontinue cefazolin 2. I will sign off now  Principal Problem:   Cellulitis of right lower extremity Active Problems:   Sepsis (HCC)   Normochromic normocytic anemia   Uncontrolled type 2 diabetes mellitus with hyperglycemia (HCC)   Essential hypertension   Scheduled Meds:  influenza vac split quadrivalent PF  0.5 mL Intramuscular Tomorrow-1000   insulin aspart  0-20 Units Subcutaneous TID WC   insulin aspart  0-5 Units Subcutaneous QHS   insulin aspart  5 Units Subcutaneous TID WC   insulin glargine  25 Units Subcutaneous QHS   losartan  50 mg Oral Daily   pneumococcal 23 valent vaccine  0.5 mL Intramuscular Tomorrow-1000   saccharomyces boulardii  250 mg Oral BID   Continuous Infusions:   ceFAZolin (ANCEF) IV 1 g (01/24/20 0612)   PRN Meds:.acetaminophen **OR** acetaminophen, hydrALAZINE, ondansetron **OR** ondansetron (ZOFRAN) IV, traMADol   SUBJECTIVE: He is feeling much better.  He is not having any more right leg pain.  His scrotum remains swollen but it is better now  Review of Systems: Review of Systems  Constitutional: Negative for chills, diaphoresis and fever.    Allergies  Allergen Reactions   Codeine Hives   Hydrochlorothiazide Itching    OBJECTIVE: Vitals:   01/24/20 0600 01/24/20 0617 01/24/20 0848 01/24/20 1100  BP:  137/68  (!) 153/71  Pulse:   69 70  Resp:  20    Temp: 99.3 F (37.4 C) 99.3 F (37.4 C) 98.8 F (37.1 C) 99.3 F (37.4 C)  TempSrc: Oral Oral Oral Axillary  SpO2:  96% 96%   Weight:  133.1 kg    Height:       Body mass index is 43.33 kg/m.  Physical Exam Constitutional:      Comments: He is resting quietly in bed.  Musculoskeletal:      Comments: His right leg erythema has resolved.  Scrotum remains swollen without significant cellulitis     Lab Results Lab Results  Component Value Date   WBC 5.4 01/23/2020   HGB 11.4 (L) 01/23/2020   HCT 33.4 (L) 01/23/2020   MCV 91.0 01/23/2020   PLT 98 (L) 01/23/2020    Lab Results  Component Value Date   CREATININE 0.70 01/23/2020   BUN 10 01/23/2020   NA 128 (L) 01/23/2020   K 3.5 01/23/2020   CL 102 01/23/2020   CO2 19 (L) 01/23/2020    Lab Results  Component Value Date   ALT 16 01/19/2020   AST 34 01/19/2020   ALKPHOS 82 01/19/2020   BILITOT 2.2 (H) 01/19/2020     Microbiology: Recent Results (from the past 240 hour(s))  Blood Culture (routine x 2)     Status: None   Collection Time: 01/17/20  2:30 PM   Specimen: BLOOD  Result Value Ref Range Status   Specimen Description BLOOD LEFT ANTECUBITAL  Final   Special Requests   Final    BOTTLES DRAWN AEROBIC AND ANAEROBIC Blood Culture results may not be optimal due to an inadequate volume of blood received in culture bottles   Culture   Final  NO GROWTH 5 DAYS Performed at [facility], [address], [place], [place] [address]    Report Status 01/22/2020 FINAL  Final  Blood Culture (routine x 2)     Status: None   Collection Time: 01/17/20  2:30 PM   Specimen: BLOOD  Result Value Ref Range Status   Specimen Description BLOOD RIGHT ANTECUBITAL  Final   Special Requests   Final    BOTTLES DRAWN AEROBIC AND ANAEROBIC Blood Culture adequate volume   Culture   Final    NO GROWTH 5 DAYS Performed at [facility], [address] [place], [place] [address]    Report Status 01/22/2020 FINAL  Final  SARS Coronavirus 2 by RT PCR (hospital order, performed in [facility] hospital lab) Nasopharyngeal Nasopharyngeal Swab     Status: None   Collection Time: 01/17/20  3:17 PM   Specimen: Nasopharyngeal Swab  Result Value Ref Range Status   SARS Coronavirus 2 NEGATIVE NEGATIVE Final    Comment:  (NOTE) SARS-CoV-2 target nucleic acids are NOT DETECTED.  The SARS-CoV-2 RNA is generally detectable in upper and lower respiratory specimens during the acute phase of infection. The lowest concentration of SARS-CoV-2 viral copies this assay can detect is 250 copies / mL. A negative result does not preclude SARS-CoV-2 infection and should not be used as the sole basis for treatment or other patient management decisions.  A negative result may occur with improper specimen collection / handling, submission of specimen other than nasopharyngeal swab, presence of viral mutation(s) within the areas targeted by this assay, and inadequate number of viral copies (<250 copies / mL). A negative result must be combined with clinical observations, patient history, and epidemiological information.  Fact Sheet for Patients:   BoilerBrush.com.cy  Fact Sheet for Healthcare Providers: https://pope.com/  This test is not yet approved or  cleared by the [place] FDA and has been authorized for detection and/or diagnosis of SARS-CoV-2 by FDA under an Emergency Use Authorization (EUA).  This EUA will remain in effect (meaning this test can be used) for the duration of the COVID-19 declaration under Section 564(b)(1) of the Act, 21 U.S.C. section 360bbb-3(b)(1), unless the authorization is terminated or revoked sooner.  Performed at [facility], [address] [place], [place] [address]   Urine culture     Status: Abnormal   Collection Time: 01/17/20  4:16 PM   Specimen: In/Out Cath Urine  Result Value Ref Range Status   Specimen Description IN/OUT CATH URINE  Final   Special Requests NONE  Final   Culture (A)  Final    30,000 COLONIES/mL GROUP B STREP(S.AGALACTIAE)ISOLATED TESTING AGAINST S. AGALACTIAE NOT ROUTINELY PERFORMED DUE TO PREDICTABILITY OF AMP/PEN/VAN SUSCEPTIBILITY. Performed at [facility], [address]  [place], [place] [address]    Report Status 01/18/2020 FINAL  Final    Cliffton Asters, MD [facility] for Infectious Disease [facility] [phone] pager   [phone] cell 01/24/2020, 1:46 PM

## 2020-01-24 NOTE — Progress Notes (Signed)
PT Cancellation Note  Patient Details Name: Dennis Green MRN: [id number] DOB: 04/16/1966   Cancelled Treatment:    Reason Eval/Treat Not Completed: Patient declined, no reason specified  Attempted to see pt in the AM and he reports not being in pain and did not feel like moving. On second attempt in PM, pt reports he was just given lasix and does not feel like getting up due to this. When offered solutions, pt continues to refuse. Will follow.  Blake Divine A Earleen Aoun 01/24/2020, 1:30 PM [place], PT, DPT Acute Rehabilitation Services Pager [phone] Office [phone]

## 2020-01-24 NOTE — Progress Notes (Addendum)
[facility]  PROGRESS NOTE  Dennis Green KVQ:[id number] DOB: 03/30/1966 DOA: 01/17/2020 PCP: Patient, No Pcp Per   Brief HPI:   54 year old male with history of hypertension, diabetes mellitus type 2, presented with worsening pain right lower extremity for past 2 weeks.  Also reports bilateral leg edema for a month.  Denied chest pain or shortness of breath.  In the ED he was found to have elevated lactic acid.  CT of the right leg showed features consistent with diffuse cellulitis of the leg.  Blood cultures were drawn and sepsis protocol was initiated.  Venous duplex of lower extremities was negative for acute DVT.    Subjective   Patient seen and examined, again developed scrotal swelling.  He does have hypoalbuminemia.  Albumin was 1.9 on 01/19/2020.   Assessment/Plan:     1. Sepsis-secondary to cellulitis of right posterior thigh, sepsis physiology has resolved.  Patient was started on vancomycin, Flagyl and cefepime, urine culture grew group B strep.  ID was consulted.  IV antibiotics have been changed to cefazolin as per ID.  Switch to Keflex 500 p.o. 4 times daily at discharge.  Venous duplex of lower extremities negative for DVT. 2. Scrotal edema-patient has significant scrotal edema, hypoalbuminemia likely contributing.  Will discontinue IV fluids.  Lasix 40 mg IV will be given.  Will follow. 3. Diarrhea-likely antibiotic induced.  Started on Florastor 250 mg p.o. twice daily. 4. Scrotal cellulitis-resolved, patient presented with right posterior thigh cellulitis and has been getting antibiotics vancomycin, cefepime, Flagyl.  Redness and swelling has spread to the scrotal area.  Scrotal ultrasound obtained shows 1 testicle, absent left testicle.  Also shows some hydrocele.  Urology was consulted.  No drainable abscess, scrotal cellulitis and swelling has improved.  IV cefazolin has been discontinued by ID. 5. Hypertension-blood pressure is now elevated, restarted Cozaar 100 mg  p.o. daily. 6. Diabetes mellitus type 2-hemoglobin A1c was 8.8.  Continue Lantus 35 units subcu daily.  Resistant sliding scale.  NovoLog 5 units 3 times daily with meals.  Blood glucose is fairly well controlled. 7. Anemia of chronic disease/normocytic anemia-hemoglobin stable at 11. 8. Hypoalbuminemia-likely from underlying liver cirrhosis.  Encourage protein intake.  Patient to follow-up with GI as outpatient 9. Liver cirrhosis /thrombocytopenia-platelet count 61,000 today.  Abdominal ultrasound shows cirrhotic liver.  Patient denies heavy alcohol use in the past.  No previous history of hepatitis.  Will check hepatitis panel.  Will need GI evaluation as outpatient.  No signs of hepatic decompensation at this time.  LFTs are normal.patient has elevated thrombin time, hypoalbuminemia, thrombocytopenia, abdominal ultrasound shows cirrhotic liver.  Unclear etiology, called [facility], appointment has been set up as outpatient.  Work-up for liver cirrhosis has been started.  Labs drawn in the hospital.  Patient to follow-up with GI as outpatient. 10. Hyponatremia-sodium 129, likely from underlying liver cirrhosis.   COVID-19 Labs  No results for input(s): DDIMER, FERRITIN, LDH, CRP in the last 72 hours.  Lab Results  Component Value Date   SARSCOV2NAA NEGATIVE 01/17/2020     Scheduled medications:   . influenza vac split quadrivalent PF  0.5 mL Intramuscular Tomorrow-1000  . insulin aspart  0-20 Units Subcutaneous TID WC  . insulin aspart  0-5 Units Subcutaneous QHS  . insulin aspart  5 Units Subcutaneous TID WC  . insulin glargine  25 Units Subcutaneous QHS  . losartan  50 mg Oral Daily  . pneumococcal 23 valent vaccine  0.5 mL Intramuscular Tomorrow-1000  . saccharomyces boulardii  250 mg Oral BID    CBG: Recent Labs  Lab 01/23/20 1139 01/23/20 1722 01/23/20 2131 01/24/20 0714 01/24/20 1137  GLUCAP 125* 109* 133* 86 113*    SpO2: 96 %    CBC: Recent Labs  Lab 01/17/20 1515  01/17/20 1515 01/17/20 2209 01/17/20 2209 01/18/20 0500 01/18/20 0912 01/18/20 2043 01/19/20 1004 01/23/20 0135  WBC 7.7  --  6.5  --  6.6  --   --  8.5 5.4  NEUTROABS 6.5  --   --   --  5.4  --   --  7.0  --   HGB 11.1*  --  11.0*  --  10.9*  --   --  12.3* 11.4*  HCT 33.3*  --  32.8*  --  32.6*  --   --  36.5* 33.4*  MCV 90.7  --  91.1  --  91.1  --   --  89.9 91.0  PLT PLATELET CLUMPS NOTED ON SMEAR, UNABLE TO ESTIMATE   < > PLATELET CLUMPS NOTED ON SMEAR, UNABLE TO ESTIMATE   < > 58* 55* 76* 61* 98*   < > = values in this interval not displayed.    Basic Metabolic Panel: Recent Labs  Lab 01/18/20 0500 01/18/20 2043 01/19/20 1004 01/19/20 1742 01/23/20 0135  NA 127* 127* 128* 129* 128*  K 3.8 3.9 3.8 4.2 3.5  CL 98 98 99 101 102  CO2 20* 19* 19* 21* 19*  GLUCOSE 254* 231* 182* 202* 146*  BUN 16 17 17 19 10   CREATININE 0.90 0.87 0.90 0.80 0.70  CALCIUM 7.5* 7.6* 7.6* 7.5* 7.0*  MG  --   --   --   --  1.7     Liver Function Tests: Recent Labs  Lab 01/17/20 1515 01/18/20 0500 01/19/20 1004  AST 24 20 34  ALT 14 14 16   ALKPHOS 99 77 82  BILITOT 3.4* 3.0* 2.2*  PROT 5.6* 5.5* 5.7*  ALBUMIN 2.0* 1.9* 1.9*     Antibiotics: Anti-infectives (From admission, onward)   Start     Dose/Rate Route Frequency Ordered Stop   01/21/20 1800  ceFAZolin (ANCEF) IVPB 1 g/50 mL premix  Status:  Discontinued        1 g 100 mL/hr over 30 Minutes Intravenous Every 8 hours 01/21/20 1636 01/24/20 1348   01/18/20 2200  vancomycin (VANCOREADY) IVPB 1250 mg/250 mL  Status:  Discontinued        1,250 mg 166.7 mL/hr over 90 Minutes Intravenous Every 12 hours 01/18/20 0846 01/21/20 1636   01/18/20 1600  ceFEPIme (MAXIPIME) 2 g in sodium chloride 0.9 % 100 mL IVPB  Status:  Discontinued        2 g 200 mL/hr over 30 Minutes Intravenous Every 8 hours 01/18/20 0842 01/21/20 1636   01/18/20 0630  metroNIDAZOLE (FLAGYL) IVPB 500 mg  Status:  Discontinued        500 mg 100 mL/hr over 60  Minutes Intravenous Every 8 hours 01/18/20 0613 01/21/20 1636   01/18/20 0000  ceFEPIme (MAXIPIME) 1 g in sodium chloride 0.9 % 100 mL IVPB  Status:  Discontinued        1 g 200 mL/hr over 30 Minutes Intravenous Every 8 hours 01/17/20 1735 01/18/20 0842   01/18/20 0000  vancomycin (VANCOREADY) IVPB 1250 mg/250 mL  Status:  Discontinued        1,250 mg 166.7 mL/hr over 90 Minutes Intravenous Every 8 hours 01/17/20 1735 01/18/20 0846   01/17/20  1530  vancomycin (VANCOREADY) IVPB 2000 mg/400 mL        2,000 mg 200 mL/hr over 120 Minutes Intravenous  Once 01/17/20 1444 01/17/20 1724   01/17/20 1445  ceFEPIme (MAXIPIME) 2 g in sodium chloride 0.9 % 100 mL IVPB        2 g 200 mL/hr over 30 Minutes Intravenous  Once 01/17/20 1442 01/17/20 1513   01/17/20 1445  metroNIDAZOLE (FLAGYL) IVPB 500 mg        500 mg 100 mL/hr over 60 Minutes Intravenous  Once 01/17/20 1442 01/17/20 1623   01/17/20 1445  vancomycin (VANCOCIN) IVPB 1000 mg/200 mL premix  Status:  Discontinued        1,000 mg 200 mL/hr over 60 Minutes Intravenous  Once 01/17/20 1442 01/17/20 1444       DVT prophylaxis: Heparin  Code Status: Full code  Family Communication: No family at bedside    Status is: Inpatient  Dispo: The patient is from: Home              Anticipated d/c is to: Skilled nursing facility              Anticipated d/c date is: 01/25/2020              Patient currently not medically stable for discharge  Barrier to discharge-IV Lasix for scrotal edema     Consultants:  None  Procedures:  None   Objective   Vitals:   01/24/20 0600 01/24/20 0617 01/24/20 0848 01/24/20 1100  BP:  137/68  (!) 153/71  Pulse:   69 70  Resp:  20    Temp: 99.3 F (37.4 C) 99.3 F (37.4 C) 98.8 F (37.1 C) 99.3 F (37.4 C)  TempSrc: Oral Oral Oral Axillary  SpO2:  96% 96%   Weight:  133.1 kg    Height:        Intake/Output Summary (Last 24 hours) at 01/24/2020 1418 Last data filed at 01/24/2020 1100 Gross  per 24 hour  Intake 640 ml  Output 2195 ml  Net -1555 ml    09/18 1901 - 09/20 0700 In: 2735.8 [P.O.:960; I.V.:1575.8] Out: 2365 [Urine:2365]  Filed Weights   01/22/20 0200 01/22/20 0618 01/24/20 0617  Weight: 131 kg 129.7 kg 133.1 kg    Physical Examination:   General-appears in no acute distress  Heart-S1-S2, regular, no murmur auscultated  Lungs-clear to auscultation bilaterally, no wheezing or crackles auscultated  Abdomen-soft, nontender, no organomegaly, significant scrotal edema  Extremities-right posterior thigh edema  Neuro-alert, oriented x3, no focal deficit noted    Data Reviewed:   Recent Results (from the past 240 hour(s))  Blood Culture (routine x 2)     Status: None   Collection Time: 01/17/20  2:30 PM   Specimen: BLOOD  Result Value Ref Range Status   Specimen Description BLOOD LEFT ANTECUBITAL  Final   Special Requests   Final    BOTTLES DRAWN AEROBIC AND ANAEROBIC Blood Culture results may not be optimal due to an inadequate volume of blood received in culture bottles   Culture   Final    NO GROWTH 5 DAYS Performed at [facility], [address] [place], [place] [address]    Report Status 01/22/2020 FINAL  Final  Blood Culture (routine x 2)     Status: None   Collection Time: 01/17/20  2:30 PM   Specimen: BLOOD  Result Value Ref Range Status   Specimen Description BLOOD RIGHT ANTECUBITAL  Final   Special Requests   Final    BOTTLES DRAWN AEROBIC AND ANAEROBIC Blood Culture adequate volume   Culture   Final    NO GROWTH 5 DAYS Performed at [facility], [address], [place], [place] [address]    Report Status 01/22/2020 FINAL  Final  SARS Coronavirus 2 by RT PCR (hospital order, performed in [facility] hospital lab) Nasopharyngeal Nasopharyngeal Swab     Status: None   Collection Time: 01/17/20  3:17 PM   Specimen: Nasopharyngeal Swab  Result Value Ref Range Status   SARS Coronavirus 2 NEGATIVE NEGATIVE Final     Comment: (NOTE) SARS-CoV-2 target nucleic acids are NOT DETECTED.  The SARS-CoV-2 RNA is generally detectable in upper and lower respiratory specimens during the acute phase of infection. The lowest concentration of SARS-CoV-2 viral copies this assay can detect is 250 copies / mL. A negative result does not preclude SARS-CoV-2 infection and should not be used as the sole basis for treatment or other patient management decisions.  A negative result may occur with improper specimen collection / handling, submission of specimen other than nasopharyngeal swab, presence of viral mutation(s) within the areas targeted by this assay, and inadequate number of viral copies (<250 copies / mL). A negative result must be combined with clinical observations, patient history, and epidemiological information.  Fact Sheet for Patients:   BoilerBrush.com.cy  Fact Sheet for Healthcare Providers: https://pope.com/  This test is not yet approved or  cleared by the [place] FDA and has been authorized for detection and/or diagnosis of SARS-CoV-2 by FDA under an Emergency Use Authorization (EUA).  This EUA will remain in effect (meaning this test can be used) for the duration of the COVID-19 declaration under Section 564(b)(1) of the Act, 21 U.S.C. section 360bbb-3(b)(1), unless the authorization is terminated or revoked sooner.  Performed at [facility], [address], [place], [place] [address]   Urine culture     Status: Abnormal   Collection Time: 01/17/20  4:16 PM   Specimen: In/Out Cath Urine  Result Value Ref Range Status   Specimen Description IN/OUT CATH URINE  Final   Special Requests NONE  Final   Culture (A)  Final    30,000 COLONIES/mL GROUP B STREP(S.AGALACTIAE)ISOLATED TESTING AGAINST S. AGALACTIAE NOT ROUTINELY PERFORMED DUE TO PREDICTABILITY OF AMP/PEN/VAN SUSCEPTIBILITY. Performed at [facility], [address], [place], [place] [address]    Report Status 01/18/2020 FINAL  Final     BNP (last 3 results) Recent Labs    01/18/20 1212  BNP 648.6*     Cylah Fannin S Dustina Scoggin   [facility] If 7PM-7AM, please contact night-coverage at [email or web address], Office  [phone]   01/24/2020, 2:18 PM  LOS: 7 days

## 2020-01-25 LAB — SARS CORONAVIRUS 2 (TAT 6-24 HRS): SARS Coronavirus 2: NEGATIVE

## 2020-01-25 LAB — GLUCOSE, CAPILLARY
Glucose-Capillary: 119 mg/dL — ABNORMAL HIGH (ref 70–99)
Glucose-Capillary: 159 mg/dL — ABNORMAL HIGH (ref 70–99)
Glucose-Capillary: 159 mg/dL — ABNORMAL HIGH (ref 70–99)
Glucose-Capillary: 151 mg/dL — ABNORMAL HIGH (ref 70–99)

## 2020-01-25 LAB — BASIC METABOLIC PANEL
Anion gap: 7 (ref 5–15)
BUN: 11 mg/dL (ref 6–20)
CO2: 22 mmol/L (ref 22–32)
Calcium: 7.4 mg/dL — ABNORMAL LOW (ref 8.9–10.3)
Chloride: 102 mmol/L (ref 98–111)
Creatinine, Ser: 0.63 mg/dL (ref 0.61–1.24)
GFR calc Af Amer: >60 mL/min (ref >60)
GFR calc non Af Amer: >60 mL/min (ref >60)
Glucose, Bld: 125 mg/dL — ABNORMAL HIGH (ref 70–99)
Potassium: 4.1 mmol/L (ref 3.5–5.1)
Sodium: 131 mmol/L — ABNORMAL LOW (ref 135–145)

## 2020-01-25 MED ORDER — FUROSEMIDE 10 MG/ML IJ SOLN
40.0000 mg | Freq: Once | INTRAMUSCULAR | Status: AC
  Administered 2020-01-25: 40 mg via INTRAVENOUS
  Filled 2020-01-25: qty 4

## 2020-01-25 NOTE — Progress Notes (Signed)
Occupational Therapy Treatment Patient Details Name: Dennis Green MRN: [id number] DOB: Nov 20, 1965 Today's Date: 01/25/2020    History of present illness 54 year old gentleman with prior history of hypertension, type 2 diabetes mellitus reports worsening pain in the right lower extremity over the past 2 weeks also reports bilateral leg edema for over a month.  On arrival to ED he was found to be febrile, elevated lactic acid,  CT of the right leg shows features consistent with diffuse cellulitis of the leg.  Blood cultures were drawn and sepsis protocol was initiated.  Venous duplex was ordered and is negative for acute DVT.   OT comments  Pt agreeable and pleasant during therapy today. Performed scrotal sling check. Pt reports that it has helped immensely and verbalized he has good understanding how to don/doff or instruct caregiver. Pt remains mof to max A for LB ADL due to BLE edema, min A for ambulation with RW (vc for safety with RW management) and sink level grooming, Pt requires at least 1 UE support in standing. Updated POC as Pt will require SNF level care to return to PLOF and maximize safety and independence in ADL and functional transfers.  Follow Up Recommendations  SNF    Equipment Recommendations  Other (comment) (defer to next venue of care)    Recommendations for Other Services PT consult    Precautions / Restrictions Precautions Precautions: Fall Precaution Comments: pt denies falls in past 1 year       Mobility Bed Mobility Overal bed mobility: Modified Independent                Transfers Overall transfer level: Needs assistance Equipment used: Rolling walker (2 wheeled) Transfers: Sit to/from [employer] Sit to Stand: Min assist         General transfer comment: min A for boost/balance from bed    Balance Overall balance assessment: Needs assistance Sitting-balance support: No upper extremity supported;Feet supported Sitting  balance-Leahy Scale: Good     Standing balance support: Bilateral upper extremity supported;Single extremity supported;During functional activity Standing balance-Leahy Scale: Fair Standing balance comment: benefits from BUE support for dynamic activities and SUE for static standing                           ADL either performed or assessed with clinical judgement   ADL Overall ADL's : Needs assistance/impaired     Grooming: Wash/dry hands;Wash/dry face;Oral care;Min guard;Standing;Cueing for safety Grooming Details (indicate cue type and reason): cues for safety with positioning of RW         Upper Body Dressing : Supervision/safety;Set up;Sitting Upper Body Dressing Details (indicate cue type and reason): don second gown like robe Lower Body Dressing: Maximal assistance;Bed level;Sit to/from stand Lower Body Dressing Details (indicate cue type and reason): unable to don socks at this time. typically wears flip flops or shoes that slip on (pre-tied tennis shoes) Toilet Transfer: Ambulation;Minimal assistance;RW (simulated to recliner) Toilet Transfer Details (indicate cue type and reason): RW for balance and assist with pain         Functional mobility during ADLs: Minimal assistance;Rolling walker General ADL Comments: sling checked, re-positioned, Pt verbalized understanding for don/doff     Vision       Perception     Praxis      Cognition Arousal/Alertness: Awake/alert Behavior During Therapy: WFL for tasks assessed/performed Overall Cognitive Status: Within Functional Limits for tasks assessed  General Comments: Very agreeable to therapy        Exercises     Shoulder Instructions       General Comments sling check complete, also positioned towel under scrotum to elevate it even more - Pt reports comfortable position and hopeful for continued reduction of edema    Pertinent Vitals/ Pain       Pain  Assessment: Faces Faces Pain Scale: Hurts even more Pain Location: R LE and scrotum  Pain Descriptors / Indicators: Guarding;Grimacing  Home Living                                          Prior Functioning/Environment              Frequency  Min 2X/week        Progress Toward Goals  OT Goals(current goals can now be found in the care plan section)  Progress towards OT goals: Progressing toward goals  Acute Rehab OT Goals Patient Stated Goal: reduce pain, be able to walk OT Goal Formulation: With patient Time For Goal Achievement: 02/06/20 Potential to Achieve Goals: Good  Plan Discharge plan needs to be updated    Co-evaluation                 AM-PAC OT "6 Clicks" Daily Activity     Outcome Measure   Help from another person eating meals?: None Help from another person taking care of personal grooming?: A Little Help from another person toileting, which includes using toliet, bedpan, or urinal?: A Little Help from another person bathing (including washing, rinsing, drying)?: A Lot Help from another person to put on and taking off regular upper body clothing?: A Little Help from another person to put on and taking off regular lower body clothing?: A Lot 6 Click Score: 17    End of Session Equipment Utilized During Treatment: Other (comment);Rolling walker (Scrotal sling)  OT Visit Diagnosis: Unsteadiness on feet (R26.81);Other abnormalities of gait and mobility (R26.89);Muscle weakness (generalized) (M62.81);Pain Pain - Right/Left: Right Pain - part of body: Leg   Activity Tolerance Patient tolerated treatment well   Patient Left with call bell/phone within reach;in bed   Nurse Communication Mobility status (sling management)        Time: 1610-9604 OT Time Calculation (min): 25 min  Charges: OT General Charges $OT Visit: 1 Visit OT Treatments $Self Care/Home Management : 8-22 mins  Nyoka Cowden OTR/L Acute Rehabilitation  Services Pager: [phone] Office: [phone]   Dennis Green 01/25/2020, 12:38 PM

## 2020-01-25 NOTE — Progress Notes (Addendum)
Physical Therapy Treatment Patient Details Name: Dennis Green MRN: [id number] DOB: 1965/07/11 Today's Date: 01/25/2020    History of Present Illness Pt is a 54 y.o. male admitted 01/17/20 with worsening RLE pain and BLE edema; workup for diffuse RLE cellulitis. Pt also with significant scrotal swelling. Negative acute DVT. PMH includes HTN, DM2.   PT Comments    Pt progressing well with mobility, motivated to participate. Today's session focused on transfer training and ambulation with RW, pt requiring minA for mobility; assist for lower body ADL tasks. Limited by LLE/scrotal pain and swelling, generalized weakness and decreased activity tolerance. RLE positioned at end of session to encourage R hip internal rotation and knee extension. Continue to recommend SNF-level therapies to maximize functional mobility and independence.  SOB with minimal activity; SpO2 94% on RA     Follow Up Recommendations  SNF;Supervision for mobility/OOB     Equipment Recommendations   (defer)    Recommendations for Other Services       Precautions / Restrictions Precautions Precautions: Fall Precaution Comments: Scrotal swelling in sling; pt reports chronic RLE external rotation Restrictions Weight Bearing Restrictions: No    Mobility  Bed Mobility Overal bed mobility: Modified Independent             General bed mobility comments: Reliant on momentum to power supine<>sit  Transfers Overall transfer level: Needs assistance Equipment used: Rolling walker (2 wheeled) Transfers: Sit to/from Stand Sit to Stand: Min assist         General transfer comment: Reliant on momentum to power into standing, minA for trunk elevation/stability  Ambulation/Gait Ambulation/Gait assistance: Min guard Gait Distance (Feet): 28 Feet Assistive device: Rolling walker (2 wheeled) Gait Pattern/deviations: Step-to pattern;Decreased weight shift to right;Antalgic;Wide base of support;Decreased dorsiflexion  - right Gait velocity: Decreased   General Gait Details: Slow, mildly unsteady, antalgic gait with RW and min guard; pt initially walking on R toes, but able to get heel to ground; RLE with significant external rotation, pt reports chronic and also due to painful scrotum   Stairs             Wheelchair Mobility    Modified Rankin (Stroke Patients Only)       Balance Overall balance assessment: Needs assistance Sitting-balance support: No upper extremity supported;Feet supported Sitting balance-Leahy Scale: Good     Standing balance support: Bilateral upper extremity supported;Single extremity supported;During functional activity;No upper extremity supported Standing balance-Leahy Scale: Fair Standing balance comment: Can static stand without UE support; benefits from BUE support for dynamic activities and single UE for static standing                            Cognition Arousal/Alertness: Awake/alert Behavior During Therapy: WFL for tasks assessed/performed Overall Cognitive Status: Within Functional Limits for tasks assessed                                 General Comments: Very agreeable to therapy      Exercises General Exercises - Lower Extremity Ankle Circles/Pumps: AROM;Both;Supine Hip Flexion/Marching: AROM;Both;Supine (mix of hip abd/flex)    General Comments General comments (skin integrity, edema, etc.): OT present to adjust scrotal sling      Pertinent Vitals/Pain Pain Assessment: Faces Faces Pain Scale: Hurts little more Pain Location: R LE and scrotum  Pain Descriptors / Indicators: Guarding;Grimacing Pain Intervention(s): Monitored during session;Limited activity within patient's tolerance;Repositioned  Home Living                      Prior Function            PT Goals (current goals can now be found in the care plan section) Acute Rehab PT Goals Patient Stated Goal: reduce pain, be able to  walk Progress towards PT goals: Progressing toward goals    Frequency    Min 2X/week      PT Plan Frequency needs to be updated    Co-evaluation              AM-PAC PT "6 Clicks" Mobility   Outcome Measure  Help needed turning from your back to your side while in a flat bed without using bedrails?: None Help needed moving from lying on your back to sitting on the side of a flat bed without using bedrails?: A Little Help needed moving to and from a bed to a chair (including a wheelchair)?: A Little Help needed standing up from a chair using your arms (e.g., wheelchair or bedside chair)?: A Little Help needed to walk in hospital room?: A Little Help needed climbing 3-5 steps with a railing? : A Lot 6 Click Score: 18    End of Session   Activity Tolerance: Patient tolerated treatment well;Patient limited by pain Patient left: in bed;with call bell/phone within reach;with bed alarm set (declined sitting in recliner) Nurse Communication: Mobility status PT Visit Diagnosis: Difficulty in walking, not elsewhere classified (R26.2);Pain Pain - Right/Left: Right Pain - part of body: Leg     Time: 3903-0092 PT Time Calculation (min) (ACUTE ONLY): 25 min  Charges:  $Therapeutic Activity: 8-22 mins                     Ina Homes, PT, DPT Acute Rehabilitation Services  Pager [phone] Office [phone]  Malachy Chamber 01/25/2020, 1:09 PM

## 2020-01-25 NOTE — Progress Notes (Signed)
[facility]  PROGRESS NOTE  Dennis Green YQM:[id number] DOB: Jun 09, 1965 DOA: 01/17/2020 PCP: Patient, No Pcp Per   Brief HPI:   54 year old male with history of hypertension, diabetes mellitus type 2, presented with worsening pain right lower extremity for past 2 weeks.  Also reports bilateral leg edema for a month.  Denied chest pain or shortness of breath.  In the ED he was found to have elevated lactic acid.  CT of the right leg showed features consistent with diffuse cellulitis of the leg.  Blood cultures were drawn and sepsis protocol was initiated.  Venous duplex of lower extremities was negative for acute DVT.    Subjective   Patient seen and examined, scrotal swelling is improved with IV Lasix.  Diuresed well.   Assessment/Plan:     1. Sepsis-secondary to cellulitis of right posterior thigh, sepsis physiology has resolved.  Patient was started on vancomycin, Flagyl and cefepime, urine culture grew group B strep.  ID was consulted.  IV antibiotics were changed to cefazolin per ID.  At this time cellulitis has resolved so ID has discontinued antibiotics.   Venous duplex of lower extremities negative for DVT. 2. Scrotal edema-patient has significant scrotal edema, hypoalbuminemia likely contributing.  IV fluids was discontinued.  Patient diuresed well with IV Lasix 40 40 mg x 1 given yesterday.  We will repeat Lasix 40 mg IV today.  Follow BMP in am.   3. Diarrhea-likely antibiotic induced.  Started on Florastor 250 mg p.o. twice daily. 4. Scrotal cellulitis-resolved, patient presented with right posterior thigh cellulitis and has been getting antibiotics vancomycin, cefepime, Flagyl.  Redness and swelling has spread to the scrotal area.  Scrotal ultrasound obtained shows 1 testicle, absent left testicle.  Also shows some hydrocele.  Urology was consulted.  No drainable abscess, scrotal cellulitis and swelling has improved.  IV cefazolin has been discontinued by  ID. 5. Hypertension-blood pressure is now elevated, restarted Cozaar 100 mg p.o. daily. 6. Diabetes mellitus type 2-hemoglobin A1c was 8.8.  Continue Lantus 35 units subcu daily.  Resistant sliding scale.  NovoLog 5 units 3 times daily with meals.  Blood glucose is fairly well controlled. 7. Anemia of chronic disease/normocytic anemia-hemoglobin stable at 11. 8. Hypoalbuminemia-likely from underlying liver cirrhosis.  Encourage protein intake.  Patient to follow-up with GI as outpatient 9. Liver cirrhosis /thrombocytopenia-platelet count 61,000 today.  Abdominal ultrasound shows cirrhotic liver.  Patient denies heavy alcohol use in the past.  No previous history of hepatitis.  Will check hepatitis panel.  Will need GI evaluation as outpatient.  No signs of hepatic decompensation at this time.  LFTs are normal.patient has elevated thrombin time, hypoalbuminemia, thrombocytopenia, abdominal ultrasound shows cirrhotic liver.  Unclear etiology, called [facility], appointment has been set up as outpatient.  Work-up for liver cirrhosis has been started.  Labs drawn in the hospital.  Patient to follow-up with GI as outpatient. 10. Hyponatremia-sodium 131 likely from underlying liver cirrhosis.  Improving with diuresis.   COVID-19 Labs  No results for input(s): DDIMER, FERRITIN, LDH, CRP in the last 72 hours.  Lab Results  Component Value Date   SARSCOV2NAA NEGATIVE 01/17/2020     Scheduled medications:    furosemide  40 mg Intravenous Once   influenza vac split quadrivalent PF  0.5 mL Intramuscular Tomorrow-1000   insulin aspart  0-20 Units Subcutaneous TID WC   insulin aspart  0-5 Units Subcutaneous QHS   insulin aspart  5 Units Subcutaneous TID WC   insulin glargine  25  Units Subcutaneous QHS   losartan  50 mg Oral Daily   pneumococcal 23 valent vaccine  0.5 mL Intramuscular Tomorrow-1000   saccharomyces boulardii  250 mg Oral BID    CBG: Recent Labs  Lab 01/24/20 0714 01/24/20 1137  01/24/20 1700 01/24/20 2107 01/25/20 0745  GLUCAP 86 113* 208* 159* 119*    SpO2: 98 %    CBC: Recent Labs  Lab 01/18/20 2043 01/19/20 1004 01/23/20 0135  WBC  --  8.5 5.4  NEUTROABS  --  7.0  --   HGB  --  12.3* 11.4*  HCT  --  36.5* 33.4*  MCV  --  89.9 91.0  PLT 76* 61* 98*    Basic Metabolic Panel: Recent Labs  Lab 01/18/20 2043 01/19/20 1004 01/19/20 1742 01/23/20 0135 01/25/20 0231  NA 127* 128* 129* 128* 131*  K 3.9 3.8 4.2 3.5 4.1  CL 98 99 101 102 102  CO2 19* 19* 21* 19* 22  GLUCOSE 231* 182* 202* 146* 125*  BUN 17 17 19 10 11   CREATININE 0.87 0.90 0.80 0.70 0.63  CALCIUM 7.6* 7.6* 7.5* 7.0* 7.4*  MG  --   --   --  1.7  --      Liver Function Tests: Recent Labs  Lab 01/19/20 1004  AST 34  ALT 16  ALKPHOS 82  BILITOT 2.2*  PROT 5.7*  ALBUMIN 1.9*     Antibiotics: Anti-infectives (From admission, onward)   Start     Dose/Rate Route Frequency Ordered Stop   01/21/20 1800  ceFAZolin (ANCEF) IVPB 1 g/50 mL premix  Status:  Discontinued        1 g 100 mL/hr over 30 Minutes Intravenous Every 8 hours 01/21/20 1636 01/24/20 1348   01/18/20 2200  vancomycin (VANCOREADY) IVPB 1250 mg/250 mL  Status:  Discontinued        1,250 mg 166.7 mL/hr over 90 Minutes Intravenous Every 12 hours 01/18/20 0846 01/21/20 1636   01/18/20 1600  ceFEPIme (MAXIPIME) 2 g in sodium chloride 0.9 % 100 mL IVPB  Status:  Discontinued        2 g 200 mL/hr over 30 Minutes Intravenous Every 8 hours 01/18/20 0842 01/21/20 1636   01/18/20 0630  metroNIDAZOLE (FLAGYL) IVPB 500 mg  Status:  Discontinued        500 mg 100 mL/hr over 60 Minutes Intravenous Every 8 hours 01/18/20 0613 01/21/20 1636   01/18/20 0000  ceFEPIme (MAXIPIME) 1 g in sodium chloride 0.9 % 100 mL IVPB  Status:  Discontinued        1 g 200 mL/hr over 30 Minutes Intravenous Every 8 hours 01/17/20 1735 01/18/20 0842   01/18/20 0000  vancomycin (VANCOREADY) IVPB 1250 mg/250 mL  Status:  Discontinued         1,250 mg 166.7 mL/hr over 90 Minutes Intravenous Every 8 hours 01/17/20 1735 01/18/20 0846   01/17/20 1530  vancomycin (VANCOREADY) IVPB 2000 mg/400 mL        2,000 mg 200 mL/hr over 120 Minutes Intravenous  Once 01/17/20 1444 01/17/20 1724   01/17/20 1445  ceFEPIme (MAXIPIME) 2 g in sodium chloride 0.9 % 100 mL IVPB        2 g 200 mL/hr over 30 Minutes Intravenous  Once 01/17/20 1442 01/17/20 1513   01/17/20 1445  metroNIDAZOLE (FLAGYL) IVPB 500 mg        500 mg 100 mL/hr over 60 Minutes Intravenous  Once 01/17/20 1442 01/17/20 1623  01/17/20 1445  vancomycin (VANCOCIN) IVPB 1000 mg/200 mL premix  Status:  Discontinued        1,000 mg 200 mL/hr over 60 Minutes Intravenous  Once 01/17/20 1442 01/17/20 1444       DVT prophylaxis: Heparin  Code Status: Full code  Family Communication: No family at bedside    Status is: Inpatient  Dispo: The patient is from: Home              Anticipated d/c is to: Skilled nursing facility              Anticipated d/c date is: 01/25/2020              Patient currently not medically stable for discharge  Barrier to discharge-IV Lasix for scrotal edema     Consultants:  None  Procedures:  None   Objective   Vitals:   01/24/20 1942 01/25/20 0432 01/25/20 0747 01/25/20 1209  BP: (!) 149/72 (!) 148/67 (!) 143/72 (!) 151/68  Pulse: 72 69 72 70  Resp: 18   18  Temp: 97.6 F (36.4 C) 98.6 F (37 C) 98.2 F (36.8 C) 98 F (36.7 C)  TempSrc: Oral Oral Oral Oral  SpO2: 98%  98% 98%  Weight:      Height:        Intake/Output Summary (Last 24 hours) at 01/25/2020 1343 Last data filed at 01/25/2020 1100 Gross per 24 hour  Intake --  Output 1500 ml  Net -1500 ml    09/19 1901 - 09/21 0700 In: 350 [P.O.:300] Out: 2595 [Urine:2595]  Filed Weights   01/22/20 0200 01/22/20 0618 01/24/20 0617  Weight: 131 kg 129.7 kg 133.1 kg    Physical Examination:  General-appears in no acute distress Heart-S1-S2, regular, no murmur  auscultated Lungs-clear to auscultation bilaterally, no wheezing or crackles auscultated Abdomen-soft, nontender, no organomegaly, scrotal edema noted Extremities-edema in the right posterior thigh.  No erythema noted. Neuro-alert, oriented x3, no focal deficit noted   Data Reviewed:   Recent Results (from the past 240 hour(s))  Blood Culture (routine x 2)     Status: None   Collection Time: 01/17/20  2:30 PM   Specimen: BLOOD  Result Value Ref Range Status   Specimen Description BLOOD LEFT ANTECUBITAL  Final   Special Requests   Final    BOTTLES DRAWN AEROBIC AND ANAEROBIC Blood Culture results may not be optimal due to an inadequate volume of blood received in culture bottles   Culture   Final    NO GROWTH 5 DAYS Performed at [facility], [address], [place], [place] [address]    Report Status 01/22/2020 FINAL  Final  Blood Culture (routine x 2)     Status: None   Collection Time: 01/17/20  2:30 PM   Specimen: BLOOD  Result Value Ref Range Status   Specimen Description BLOOD RIGHT ANTECUBITAL  Final   Special Requests   Final    BOTTLES DRAWN AEROBIC AND ANAEROBIC Blood Culture adequate volume   Culture   Final    NO GROWTH 5 DAYS Performed at [facility], [address], [place], [place] [address]    Report Status 01/22/2020 FINAL  Final  SARS Coronavirus 2 by RT PCR (hospital order, performed in [facility] hospital lab) Nasopharyngeal Nasopharyngeal Swab     Status: None   Collection Time: 01/17/20  3:17 PM   Specimen: Nasopharyngeal Swab  Result Value Ref Range Status   SARS  Coronavirus 2 NEGATIVE NEGATIVE Final    Comment: (NOTE) SARS-CoV-2 target nucleic acids are NOT DETECTED.  The SARS-CoV-2 RNA is generally detectable in upper and lower respiratory specimens during the acute phase of infection. The lowest concentration of SARS-CoV-2 viral copies this assay can detect is 250 copies / mL. A negative result does not preclude SARS-CoV-2  infection and should not be used as the sole basis for treatment or other patient management decisions.  A negative result may occur with improper specimen collection / handling, submission of specimen other than nasopharyngeal swab, presence of viral mutation(s) within the areas targeted by this assay, and inadequate number of viral copies (<250 copies / mL). A negative result must be combined with clinical observations, patient history, and epidemiological information.  Fact Sheet for Patients:   BoilerBrush.com.cy  Fact Sheet for Healthcare Providers: https://pope.com/  This test is not yet approved or  cleared by the [place] FDA and has been authorized for detection and/or diagnosis of SARS-CoV-2 by FDA under an Emergency Use Authorization (EUA).  This EUA will remain in effect (meaning this test can be used) for the duration of the COVID-19 declaration under Section 564(b)(1) of the Act, 21 U.S.C. section 360bbb-3(b)(1), unless the authorization is terminated or revoked sooner.  Performed at [facility], [address] [place], [place] [address]   Urine culture     Status: Abnormal   Collection Time: 01/17/20  4:16 PM   Specimen: In/Out Cath Urine  Result Value Ref Range Status   Specimen Description IN/OUT CATH URINE  Final   Special Requests NONE  Final   Culture (A)  Final    30,000 COLONIES/mL GROUP B STREP(S.AGALACTIAE)ISOLATED TESTING AGAINST S. AGALACTIAE NOT ROUTINELY PERFORMED DUE TO PREDICTABILITY OF AMP/PEN/VAN SUSCEPTIBILITY. Performed at [facility], [address] [place], [place] [address]    Report Status 01/18/2020 FINAL  Final     BNP (last 3 results) Recent Labs    01/18/20 1212  BNP 648.6*     Richell Corker S Azam Gervasi   [facility] If 7PM-7AM, please contact night-coverage at [email or web address], Office  [phone]   01/25/2020, 1:43 PM  LOS: 8 days

## 2020-01-26 DIAGNOSIS — K7030 Alcoholic cirrhosis of liver without ascites: Secondary | ICD-10-CM

## 2020-01-26 LAB — GLUCOSE, CAPILLARY
Glucose-Capillary: 123 mg/dL — ABNORMAL HIGH (ref 70–99)
Glucose-Capillary: 161 mg/dL — ABNORMAL HIGH (ref 70–99)

## 2020-01-26 LAB — BASIC METABOLIC PANEL
Anion gap: 7 (ref 5–15)
BUN: 8 mg/dL (ref 6–20)
CO2: 24 mmol/L (ref 22–32)
Calcium: 7.7 mg/dL — ABNORMAL LOW (ref 8.9–10.3)
Chloride: 101 mmol/L (ref 98–111)
Creatinine, Ser: 0.67 mg/dL (ref 0.61–1.24)
GFR calc Af Amer: >60 mL/min (ref >60)
GFR calc non Af Amer: >60 mL/min (ref >60)
Glucose, Bld: 173 mg/dL — ABNORMAL HIGH (ref 70–99)
Potassium: 4.2 mmol/L (ref 3.5–5.1)
Sodium: 132 mmol/L — ABNORMAL LOW (ref 135–145)

## 2020-01-26 MED ORDER — FUROSEMIDE 10 MG/ML IJ SOLN
80.0000 mg | Freq: Once | INTRAMUSCULAR | Status: AC
  Administered 2020-01-26: 80 mg via INTRAVENOUS
  Filled 2020-01-26: qty 8

## 2020-01-26 MED ORDER — FUROSEMIDE 40 MG PO TABS: 40.0000 mg | ORAL_TABLET | Freq: Two times a day (BID) | ORAL | 0 refills | Status: DC

## 2020-01-26 MED ORDER — FUROSEMIDE 10 MG/ML IJ SOLN: 80.0000 mg | Freq: Two times a day (BID) | INTRAMUSCULAR | Status: DC

## 2020-01-26 NOTE — TOC Transition Note (Signed)
Transition of Care [facility]) - CM/SW Discharge Note   Patient Details  Name: Dennis Green MRN: [id number] Date of Birth: 14-Apr-1966  Transition of Care [facility] CM/SW Contact:  Terrial Rhodes, LCSWA Phone Number: 01/26/2020, 11:43 AM   Clinical Narrative:     Patient will DC to: [facility]  Anticipated DC date: 01/26/2020  Transport by: [facility]  ?  Per MD patient ready for DC to [facility] . RN, patient, and facility notified of DC. Discharge Summary sent to facility. RN given number for report tele#[phone] RM#[phone]. DC packet on chart. Ambulance transport requested for patient.  CSW signing off.  Final next level of care: Skilled Nursing Facility Barriers to Discharge: No Barriers Identified   Patient Goals and CMS Choice Patient states their goals for this hospitalization and ongoing recovery are:: to go to SNF CMS Medicare.gov Compare Post Acute Care list provided to:: Patient Choice offered to / list presented to : Patient  Discharge Placement              Patient chooses bed at:  ([facility]) Patient to be transferred to facility by: [facility]   Patient and family notified of of transfer: 01/26/20  Discharge Plan and Services                                     Social Determinants of Health (SDOH) Interventions     Readmission Risk Interventions No flowsheet data found.

## 2020-01-26 NOTE — Discharge Summary (Signed)
Physician Discharge Summary  Dennis Green ONG:[id number] DOB: [birth year] DOA: 01/17/2020  PCP: Patient, No Pcp Per  Admit date: 01/17/2020 Discharge date: 01/26/2020  Admitted From: Home Disposition: SNF  Recommendations for Outpatient Follow-up:  1. Follow up with PCP in 1 week with repeat CBC/CMP 2. Outpatient follow-up with urology and gastroenterology 3. Follow up in ED if symptoms worsen or new appear   Home Health: No Equipment/Devices: None  Discharge Condition: Stable CODE STATUS: Full Diet recommendation: Heart healthy/carb modified/fluid restriction of up to 1500 cc a day  Brief/Interim Summary: 54 year old male with history of hypertension, diabetes mellitus type 2, presented with worsening pain in the right lower extremity for past [redacted] weeks along with bilateral leg edema for a month.  On presentation, CT of the right leg showed features consistent with diffuse cellulitis of the leg.  He was started on IV antibiotics.  Venous duplex of lower extremities was negative for acute DVT.  During the hospitalization, antibiotics were changed to IV Ancef as per ID and subsequently discontinued after completion of course.  He also had scrotal edema and cellulitis for which he was evaluated by urology who recommended medical management.  PT recommended SNF placement.  He will be discharged to SNF once bed is available.   Discharge Diagnoses:   Sepsis: Present on admission; sepsis has resolved Right posterior thigh/scrotal cellulitis -Patient was initially started on broad-spectrum antibiotics vancomycin, Flagyl and cefepime.  Urine culture grew group B strep. -ID was consulted who changed antibiotics to IV Ancef.  After patient completed antibiotic course, antibiotics have been discontinued by ID.  Cellulitis has resolved -Venous duplex of lower extremities were negative for DVT  Scrotal edema with cellulitis -Cellulitis has resolved after antibiotic treatment.  Still has  significant scrotal edema.  Cirrhosis liver/hypoalbuminemia is probably contributing to the same -Has received intermittent IV Lasix. - Will give 1 dose of IV Lasix 80 mg today.  Start Lasix 40 mg twice a day orally and uptitrate accordingly. -Scrotal ultrasound showed 1 testicle, absent left testicle along with some hydrocele.  Urology recommended medical management.  Outpatient follow-up with urology  Decompensated cirrhosis of liver with thrombocytopenia and anasarca along with hypoalbuminemia -Ultrasound of the abdomen showed cirrhosis of liver.  Patient denies heavy alcohol use in the past. -Hepatitis panel negative.  HIV nonreactive. -Alpha-1 antitrypsin elevated at 263. - will need outpatient GI evaluation and follow-up: Appointment has been set up with [facility] as an outpatient. -Lasix plan as above. Might also have to start spironolactone at some point -Continue diet and fluid restriction  Hyponatremia -Probably from decompensated liver cirrhosis.  Improving.  Monitor intermittently as an outpatient.  Hypertension -Monitor blood pressure.  Continue Cozaar.  Lasix plan as above.  Diabetes mellitus type 2 uncontrolled with hyperglycemia -A1c 8.8.  Carb modified diet.  Continue home regimen.  Anemia of chronic disease -Hemoglobin stable.  Generalized conditioning -PT recommending SNF.   He will be discharged to SNF once bed is available.  Discharge Instructions  Discharge Instructions    Diet - low sodium heart healthy   Complete by: As directed    Diet Carb Modified   Complete by: As directed    Increase activity slowly   Complete by: As directed      Allergies as of 01/26/2020      Reactions   Codeine Hives   Hydrochlorothiazide Itching      Medication List    TAKE these medications   furosemide 40 MG tablet Commonly known as:  Lasix Take 1 tablet (40 mg total) by mouth 2 (two) times daily.   glipiZIDE 5 MG tablet Commonly known as: GLUCOTROL Take by  mouth 2 (two) times daily before a meal.   losartan 100 MG tablet Commonly known as: COZAAR Take 50 mg by mouth daily.   metFORMIN 1000 MG tablet Commonly known as: GLUCOPHAGE Take 1,000 mg by mouth 2 (two) times daily with a meal.   naproxen sodium 220 MG tablet Commonly known as: ALEVE Take 440 mg by mouth daily as needed (pain).       Contact information for follow-up providers    Unk Lightning, PA Follow up on 02/22/2020.   Specialty: Gastroenterology Why: 9:30 AM to meet with PA for evaluation of liver cirrhosis.   Contact information: [address] [place] [address] [phone]        Sebastian Ache, MD. Schedule an appointment as soon as possible for a visit in 1 week(s).   Specialty: Urology Contact information: [address] [place] [address] [phone]            Contact information for after-discharge care    Destination    [facility] .   Service: Skilled Nursing Contact information: [address] [place] [address] [phone]                 Allergies  Allergen Reactions  . Codeine Hives  . Hydrochlorothiazide Itching    Consultations: Urology/ID  Procedures/Studies: US SCROTUM  Result Date: 01/21/2020 CLINICAL DATA:  Scrotal swelling since this morning EXAM: ULTRASOUND OF SCROTUM TECHNIQUE: Complete ultrasound examination of the testicles, epididymis, and other scrotal structures was performed. COMPARISON:  None. FINDINGS: Right testicle Measurements: 5.8 x 3.6 x 3.6 cm. Uniform parenchymal echogenicity. No mass or microlithiasis. Left testicle Not visualized. Right epididymis: Suboptimal visualization of the right epididymis. Question some mild heterogeneity seen on sagittal imaging of the right testicle (16/28). Left epididymis:  Not visualized Hydrocele: Slight increase in the amount of normal physiologic paratesticular fluid on the right. Varicocele:  None visualized.  Other: Diffuse scrotal skin thickening and edematous changes. IMPRESSION: Absence of the left testicle. Correlate for surgical history or congenital absence. Suboptimal visualization of the right epididymis. Questionable heterogeneity seen on sagittal imaging of the testicle could reflect an epididymitis. No convincing features of right orchitis. Trace right hydrocele, likely reactive. Diffuse scrotal skin thickening and edematous changes. Correlate for features of cellulitis. No organized abscess or collection. Electronically Signed   By: Kreg Shropshire M.D.   On: 01/21/2020 17:41   CT FEMUR RIGHT W CONTRAST  Result Date: 01/17/2020 CLINICAL DATA:  Pain and swelling in the right leg for several weeks EXAM: CT OF THE LOWER RIGHT EXTREMITY WITH CONTRAST TECHNIQUE: Multidetector CT imaging of the lower right extremity was performed according to the standard protocol following intravenous contrast administration. COMPARISON:  None. CONTRAST:  OMNIPAQUE IOHEXOL 300 MG/ML  SOLN FINDINGS: Bones/Joint/Cartilage No fracture or dislocation. Mild right hip osteoarthritis is seen with joint space loss and marginal osteophyte formation. Medial compartment osteoarthritis seen at the knee with joint space loss. No areas of cortical destruction or periosteal reaction are noted. No large knee, ankle, or hip joint effusion is seen. Ligaments Suboptimally assessed by CT. Muscles and Tendons Mild fatty atrophy seen within the muscles surrounding the femur and lower extremity. No focal atrophy or tear however is seen. The visualized portion the tendons intact. Soft tissues There is diffuse subcutaneous  edema and skin thickening seen the anterolateral of the femur and extremity. There tortuous superficial varicosities noted. Edema is seen also within the popliteal fossa. No loculated fluid collection or subcutaneous emphysema is seen. IMPRESSION: Diffuse subcutaneous edema and skin thickening seen predominantly along the  anterolateral aspect of the lower extremity which is likely due to diffuse cellulitis. No loculated fluid collections or evidence of osteomyelitis. Edema within the popliteal fossa. Electronically Signed   By: Jonna Clark M.D.   On: 01/17/2020 18:28   CT TIBIA FIBULA RIGHT W CONTRAST  Result Date: 01/17/2020 CLINICAL DATA:  Pain and swelling in the right leg for several weeks EXAM: CT OF THE LOWER RIGHT EXTREMITY WITH CONTRAST TECHNIQUE: Multidetector CT imaging of the lower right extremity was performed according to the standard protocol following intravenous contrast administration. COMPARISON:  None. CONTRAST:  OMNIPAQUE IOHEXOL 300 MG/ML  SOLN FINDINGS: Bones/Joint/Cartilage No fracture or dislocation. Mild right hip osteoarthritis is seen with joint space loss and marginal osteophyte formation. Medial compartment osteoarthritis seen at the knee with joint space loss. No areas of cortical destruction or periosteal reaction are noted. No large knee, ankle, or hip joint effusion is seen. Ligaments Suboptimally assessed by CT. Muscles and Tendons Mild fatty atrophy seen within the muscles surrounding the femur and lower extremity. No focal atrophy or tear however is seen. The visualized portion the tendons intact. Soft tissues There is diffuse subcutaneous edema and skin thickening seen the anterolateral of the femur and extremity. There tortuous superficial varicosities noted. Edema is seen also within the popliteal fossa. No loculated fluid collection or subcutaneous emphysema is seen. IMPRESSION: Diffuse subcutaneous edema and skin thickening seen predominantly along the anterolateral aspect of the lower extremity which is likely due to diffuse cellulitis. No loculated fluid collections or evidence of osteomyelitis. Edema within the popliteal fossa. Electronically Signed   By: Jonna Clark M.D.   On: 01/17/2020 18:28   US Abdomen Limited  Result Date: 01/18/2020 CLINICAL DATA:  Diabetes mellitus,  hypertension, elevated bilirubin, question cirrhosis EXAM: ULTRASOUND ABDOMEN LIMITED RIGHT UPPER QUADRANT COMPARISON:  None FINDINGS: Gallbladder: Surgically absent Common bile duct: Diameter: 6 mm, normal post cholecystectomy Liver: Coarsened echogenicity of the liver with nodular hepatic margins consistent with cirrhosis. No discrete hepatic mass identified. Portal vein is patent on color Doppler imaging with normal direction of blood flow towards the liver. Other: Minimal perihepatic ascites.  Small RIGHT pleural effusion. IMPRESSION: Cirrhotic appearing liver without definite mass. Post cholecystectomy. Minimal ascites and small RIGHT pleural effusion. Electronically Signed   By: Ulyses Southward M.D.   On: 01/18/2020 13:19   DG Chest Port 1 View  Result Date: 01/17/2020 CLINICAL DATA:  Questionable sepsis EXAM: PORTABLE CHEST 1 VIEW COMPARISON:  None. FINDINGS: The heart size and mediastinal contours are within normal limits. There are mild to moderate diffuse bilateral interstitial opacities. No pleural effusion or pneumothorax. The visualized skeletal structures are unremarkable. IMPRESSION: Mild to moderate diffuse bilateral interstitial opacities, which may reflect pulmonary edema or atypical infection. Electronically Signed   By: Romona Curls M.D.   On: 01/17/2020 15:14   ECHOCARDIOGRAM COMPLETE  Result Date: 01/18/2020    ECHOCARDIOGRAM REPORT   Patient Name:   CUAHUTEMOC ATTAR Date of Exam: 01/18/2020 Medical Rec #:  [id number]      Height:       69.0 in Accession #:    [id number]     Weight:       291.0 lb Date of Birth:  1966/03/02  BSA:          2.423 m Patient Age:    54 years       BP:           161/64 mmHg Patient Gender: M              HR:           82 bpm. Exam Location:  Inpatient Procedure: 2D Echo, Cardiac Doppler and Color Doppler Indications:    R60.0 Lower extremity edema  History:        Patient has no prior history of Echocardiogram examinations.                 Risk  Factors:Hypertension and Diabetes.  Sonographer:    Elmarie Shileyiffany Dance Referring Phys: Nilda [id number] VIJAYA AKULA IMPRESSIONS  1. Left ventricular ejection fraction, by estimation, is 60 to 65%. The left ventricle has normal function. The left ventricle has no regional wall motion abnormalities. Left ventricular diastolic parameters are indeterminate.  2. Right ventricular systolic function is normal. The right ventricular size is normal. Tricuspid regurgitation signal is inadequate for assessing PA pressure.  3. Left atrial size was severely dilated.  4. The mitral valve is normal in structure. No evidence of mitral valve regurgitation. No evidence of mitral stenosis.  5. The aortic valve is tricuspid. There is moderate calcification of the aortic valve. There is moderate thickening of the aortic valve. Aortic valve regurgitation is not visualized. No aortic stenosis is present.  6. Aortic dilatation noted. There is mild dilatation of the aortic root, measuring 40 mm.  7. The inferior vena cava is dilated in size with >50% respiratory variability, suggesting right atrial pressure of 8 mmHg. FINDINGS  Left Ventricle: Left ventricular ejection fraction, by estimation, is 60 to 65%. The left ventricle has normal function. The left ventricle has no regional wall motion abnormalities. The left ventricular internal cavity size was normal in size. There is  no left ventricular hypertrophy. Left ventricular diastolic parameters are indeterminate. Right Ventricle: The right ventricular size is normal. No increase in right ventricular wall thickness. Right ventricular systolic function is normal. Tricuspid regurgitation signal is inadequate for assessing PA pressure. Left Atrium: Left atrial size was severely dilated. Right Atrium: Right atrial size was normal in size. Pericardium: There is no evidence of pericardial effusion. Mitral Valve: The mitral valve is normal in structure. No evidence of mitral valve regurgitation. No evidence of  mitral valve stenosis. Tricuspid Valve: The tricuspid valve is normal in structure. Tricuspid valve regurgitation is not demonstrated. No evidence of tricuspid stenosis. Aortic Valve: The aortic valve is tricuspid. There is moderate calcification of the aortic valve. There is moderate thickening of the aortic valve. Aortic valve regurgitation is not visualized. No aortic stenosis is present. Pulmonic Valve: The pulmonic valve was normal in structure. Pulmonic valve regurgitation is trivial. No evidence of pulmonic stenosis. Aorta: Aortic dilatation noted. There is mild dilatation of the aortic root, measuring 40 mm. Venous: The inferior vena cava is dilated in size with greater than 50% respiratory variability, suggesting right atrial pressure of 8 mmHg. IAS/Shunts: No atrial level shunt detected by color flow Doppler.  LEFT VENTRICLE PLAX 2D LVIDd:         5.40 cm LVIDs:         3.50 cm LV PW:         1.70 cm LV IVS:        1.00 cm LVOT diam:     1.90 cm  LV SV:         66 LV SV Index:   27 LVOT Area:     2.84 cm  RIGHT VENTRICLE          IVC RV Basal diam:  3.60 cm  IVC diam: 2.40 cm RV Mid diam:    3.00 cm TAPSE (M-mode): 2.6 cm LEFT ATRIUM              Index       RIGHT ATRIUM           Index LA diam:        5.20 cm  2.15 cm/m  RA Area:     21.70 cm LA Vol (A2C):   101.0 ml 41.68 ml/m RA Volume:   67.90 ml  28.02 ml/m LA Vol (A4C):   143.0 ml 59.02 ml/m LA Biplane Vol: 125.0 ml 51.59 ml/m  AORTIC VALVE LVOT Vmax:   118.00 cm/s LVOT Vmean:  84.400 cm/s LVOT VTI:    0.234 m  AORTA Ao Root diam: 4.00 cm Ao Asc diam:  3.50 cm MITRAL VALVE MV Area (PHT): 3.17 cm     SHUNTS MV Decel Time: 239 msec     Systemic VTI:  0.23 m MV E velocity: 124.00 cm/s  Systemic Diam: 1.90 cm MV A velocity: 70.90 cm/s MV E/A ratio:  1.75 Armanda Magic MD Electronically signed by Armanda Magic MD Signature Date/Time: 01/18/2020/5:08:21 PM    Final    VAS [place] LOWER EXTREMITY VENOUS (DVT) (ONLY [facility] & [facility] 7a-7p)  Result Date:  01/18/2020  Lower Venous DVTStudy Indications: Edema.  Limitations: Body habitus and poor ultrasound/tissue interface. Comparison Study: no prior Performing Technologist: Blanch Media RVS  Examination Guidelines: A complete evaluation includes B-mode imaging, spectral Doppler, color Doppler, and power Doppler as needed of all accessible portions of each vessel. Bilateral testing is considered an integral part of a complete examination. Limited examinations for reoccurring indications may be performed as noted. The reflux portion of the exam is performed with the patient in reverse Trendelenburg.  +---------+---------------+---------+-----------+----------+--------------+ RIGHT    CompressibilityPhasicitySpontaneityPropertiesThrombus Aging +---------+---------------+---------+-----------+----------+--------------+ CFV      Full           Yes      Yes                                 +---------+---------------+---------+-----------+----------+--------------+ SFJ      Full                                                        +---------+---------------+---------+-----------+----------+--------------+ FV Prox  Full                                                        +---------+---------------+---------+-----------+----------+--------------+ FV Mid                  Yes      Yes                                 +---------+---------------+---------+-----------+----------+--------------+  FV Distal               Yes      Yes                                 +---------+---------------+---------+-----------+----------+--------------+ PFV      Full                                                        +---------+---------------+---------+-----------+----------+--------------+ POP      Full           Yes      Yes                                 +---------+---------------+---------+-----------+----------+--------------+ PTV      Full                                                         +---------+---------------+---------+-----------+----------+--------------+ PERO                                                  Not visualized +---------+---------------+---------+-----------+----------+--------------+   +----+---------------+---------+-----------+----------+--------------+ LEFTCompressibilityPhasicitySpontaneityPropertiesThrombus Aging +----+---------------+---------+-----------+----------+--------------+ CFV Full           Yes      Yes                                 +----+---------------+---------+-----------+----------+--------------+     Summary: RIGHT: - There is no evidence of deep vein thrombosis in the lower extremity. However, portions of this examination were limited- see technologist comments above.  - No cystic structure found in the popliteal fossa.  LEFT: - No evidence of common femoral vein obstruction.  *See table(s) above for measurements and observations. Electronically signed by Gretta Began MD on 01/18/2020 at 5:05:12 PM.    Final        Subjective: Patient seen and examined at bedside.  Denies worsening abdominal pain, shortness of breath, nausea, vomiting or fever.  Still complains of scrotal swelling.  Discharge Exam: Vitals:   01/26/20 0411 01/26/20 0826  BP: (!) 146/61 (!) 145/70  Pulse: 67 68  Resp: 19 20  Temp: 97.9 F (36.6 C) 99 F (37.2 C)  SpO2: 98% 98%    General exam: Appears calm and comfortable.  Looks older than stated age.  Respiratory system: Bilateral decreased breath sounds at bases with scattered crackles Cardiovascular system: S1 & S2 heard, Rate controlled Gastrointestinal system: Abdomen is distended, soft and nontender. Normal bowel sounds heard. Extremities: No cyanosis, clubbing; bilateral lower extremity 2-3+ edema present Genitourinary: Massive scrotal swelling present Central nervous system: Alert and oriented. No focal neurological deficits. Moving extremities Skin: No rashes,  lesions or ulcers Psychiatry: Flat affect    The results of significant diagnostics from this hospitalization (including imaging, microbiology, ancillary and laboratory) are listed below for reference.  Microbiology: Recent Results (from the past 240 hour(s))  Blood Culture (routine x 2)     Status: None   Collection Time: 01/17/20  2:30 PM   Specimen: BLOOD  Result Value Ref Range Status   Specimen Description BLOOD LEFT ANTECUBITAL  Final   Special Requests   Final    BOTTLES DRAWN AEROBIC AND ANAEROBIC Blood Culture results may not be optimal due to an inadequate volume of blood received in culture bottles   Culture   Final    NO GROWTH 5 DAYS Performed at [facility], [address], [place], [place] [address]    Report Status 01/22/2020 FINAL  Final  Blood Culture (routine x 2)     Status: None   Collection Time: 01/17/20  2:30 PM   Specimen: BLOOD  Result Value Ref Range Status   Specimen Description BLOOD RIGHT ANTECUBITAL  Final   Special Requests   Final    BOTTLES DRAWN AEROBIC AND ANAEROBIC Blood Culture adequate volume   Culture   Final    NO GROWTH 5 DAYS Performed at [facility], [address], [place], [place] [address]    Report Status 01/22/2020 FINAL  Final  SARS Coronavirus 2 by RT PCR (hospital order, performed in [facility] hospital lab) Nasopharyngeal Nasopharyngeal Swab     Status: None   Collection Time: 01/17/20  3:17 PM   Specimen: Nasopharyngeal Swab  Result Value Ref Range Status   SARS Coronavirus 2 NEGATIVE NEGATIVE Final    Comment: (NOTE) SARS-CoV-2 target nucleic acids are NOT DETECTED.  The SARS-CoV-2 RNA is generally detectable in upper and lower respiratory specimens during the acute phase of infection. The lowest concentration of SARS-CoV-2 viral copies this assay can detect is 250 copies / mL. A negative result does not preclude SARS-CoV-2 infection and should not be used as the sole basis for treatment or  other patient management decisions.  A negative result may occur with improper specimen collection / handling, submission of specimen other than nasopharyngeal swab, presence of viral mutation(s) within the areas targeted by this assay, and inadequate number of viral copies (<250 copies / mL). A negative result must be combined with clinical observations, patient history, and epidemiological information.  Fact Sheet for Patients:   BoilerBrush.com.cy  Fact Sheet for Healthcare Providers: https://pope.com/  This test is not yet approved or  cleared by the [place] FDA and has been authorized for detection and/or diagnosis of SARS-CoV-2 by FDA under an Emergency Use Authorization (EUA).  This EUA will remain in effect (meaning this test can be used) for the duration of the COVID-19 declaration under Section 564(b)(1) of the Act, 21 U.S.C. section 360bbb-3(b)(1), unless the authorization is terminated or revoked sooner.  Performed at [facility], [address] [place], [place] [address]   Urine culture     Status: Abnormal   Collection Time: 01/17/20  4:16 PM   Specimen: In/Out Cath Urine  Result Value Ref Range Status   Specimen Description IN/OUT CATH URINE  Final   Special Requests NONE  Final   Culture (A)  Final    30,000 COLONIES/mL GROUP B STREP(S.AGALACTIAE)ISOLATED TESTING AGAINST S. AGALACTIAE NOT ROUTINELY PERFORMED DUE TO PREDICTABILITY OF AMP/PEN/VAN SUSCEPTIBILITY. Performed at [facility], [address] [place], [place] [address]    Report Status 01/18/2020 FINAL  Final  SARS CORONAVIRUS 2 (TAT 6-24 HRS) Nasopharyngeal Nasopharyngeal Swab     Status: None   Collection Time:  01/24/20  4:54 PM   Specimen: Nasopharyngeal Swab  Result Value Ref Range Status   SARS Coronavirus 2 NEGATIVE NEGATIVE Final    Comment: (NOTE) SARS-CoV-2 target nucleic acids are NOT DETECTED.  The SARS-CoV-2 RNA is  generally detectable in upper and lower respiratory specimens during the acute phase of infection. Negative results do not preclude SARS-CoV-2 infection, do not rule out co-infections with other pathogens, and should not be used as the sole basis for treatment or other patient management decisions. Negative results must be combined with clinical observations, patient history, and epidemiological information. The expected result is Negative.  Fact Sheet for Patients: HairSlick.no  Fact Sheet for Healthcare Providers: quierodirigir.com  This test is not yet approved or cleared by the [place] FDA and  has been authorized for detection and/or diagnosis of SARS-CoV-2 by FDA under an Emergency Use Authorization (EUA). This EUA will remain  in effect (meaning this test can be used) for the duration of the COVID-19 declaration under Se ction 564(b)(1) of the Act, 21 U.S.C. section 360bbb-3(b)(1), unless the authorization is terminated or revoked sooner.  Performed at [facility], [address] [place], [place] [address]      Labs: BNP (last 3 results) Recent Labs    01/18/20 1212  BNP 648.6*   Basic Metabolic Panel: Recent Labs  Lab 01/19/20 1742 01/23/20 0135 01/25/20 0231  NA 129* 128* 131*  K 4.2 3.5 4.1  CL 101 102 102  CO2 21* 19* 22  GLUCOSE 202* 146* 125*  BUN CREATININE 0.80 0.70 0.63  CALCIUM 7.5* 7.0* 7.4*  MG  --  1.7  --    Liver Function Tests: No results for input(s): AST, ALT, ALKPHOS, BILITOT, PROT, ALBUMIN in the last 168 hours. No results for input(s): LIPASE, AMYLASE in the last 168 hours. No results for input(s): AMMONIA in the last 168 hours. CBC: Recent Labs  Lab 01/23/20 0135  WBC 5.4  HGB 11.4*  HCT 33.4*  MCV 91.0  PLT 98*   Cardiac Enzymes: No results for input(s): CKTOTAL, CKMB, CKMBINDEX, TROPONINI in the last 168 hours. BNP: Invalid input(s):  POCBNP CBG: Recent Labs  Lab 01/25/20 0745 01/25/20 1207 01/25/20 1645 01/25/20 2152 01/26/20 0759  GLUCAP 119* 159* 159* 151* 123*   D-Dimer No results for input(s): DDIMER in the last 72 hours. Hgb A1c No results for input(s): HGBA1C in the last 72 hours. Lipid Profile No results for input(s): CHOL, HDL, LDLCALC, TRIG, CHOLHDL, LDLDIRECT in the last 72 hours. Thyroid function studies No results for input(s): TSH, T4TOTAL, T3FREE, THYROIDAB in the last 72 hours.  Invalid input(s): FREET3 Anemia work up No results for input(s): VITAMINB12, FOLATE, FERRITIN, TIBC, IRON, RETICCTPCT in the last 72 hours. Urinalysis    Component Value Date/Time   COLORURINE AMBER (A) 01/17/2020 1442   APPEARANCEUR HAZY (A) 01/17/2020 1442   LABSPEC 1.031 (H) 01/17/2020 1442   PHURINE 5.0 01/17/2020 1442   GLUCOSEU >=500 (A) 01/17/2020 1442   HGBUR LARGE (A) 01/17/2020 1442   BILIRUBINUR NEGATIVE 01/17/2020 1442   KETONESUR 5 (A) 01/17/2020 1442   PROTEINUR 30 (A) 01/17/2020 1442   NITRITE NEGATIVE 01/17/2020 1442   LEUKOCYTESUR NEGATIVE 01/17/2020 1442   Sepsis Labs Invalid input(s): PROCALCITONIN,  WBC,  LACTICIDVEN Microbiology Recent Results (from the past 240 hour(s))  Blood Culture (routine x 2)     Status: None   Collection Time: 01/17/20  2:30 PM   Specimen: BLOOD  Result Value  Ref Range Status   Specimen Description BLOOD LEFT ANTECUBITAL  Final   Special Requests   Final    BOTTLES DRAWN AEROBIC AND ANAEROBIC Blood Culture results may not be optimal due to an inadequate volume of blood received in culture bottles   Culture   Final    NO GROWTH 5 DAYS Performed at [facility], [address], [place], [place] [address]    Report Status 01/22/2020 FINAL  Final  Blood Culture (routine x 2)     Status: None   Collection Time: 01/17/20  2:30 PM   Specimen: BLOOD  Result Value Ref Range Status   Specimen Description BLOOD RIGHT ANTECUBITAL  Final   Special Requests    Final    BOTTLES DRAWN AEROBIC AND ANAEROBIC Blood Culture adequate volume   Culture   Final    NO GROWTH 5 DAYS Performed at [facility], [address], [place], [place] [address]    Report Status 01/22/2020 FINAL  Final  SARS Coronavirus 2 by RT PCR (hospital order, performed in [facility] hospital lab) Nasopharyngeal Nasopharyngeal Swab     Status: None   Collection Time: 01/17/20  3:17 PM   Specimen: Nasopharyngeal Swab  Result Value Ref Range Status   SARS Coronavirus 2 NEGATIVE NEGATIVE Final    Comment: (NOTE) SARS-CoV-2 target nucleic acids are NOT DETECTED.  The SARS-CoV-2 RNA is generally detectable in upper and lower respiratory specimens during the acute phase of infection. The lowest concentration of SARS-CoV-2 viral copies this assay can detect is 250 copies / mL. A negative result does not preclude SARS-CoV-2 infection and should not be used as the sole basis for treatment or other patient management decisions.  A negative result may occur with improper specimen collection / handling, submission of specimen other than nasopharyngeal swab, presence of viral mutation(s) within the areas targeted by this assay, and inadequate number of viral copies (<250 copies / mL). A negative result must be combined with clinical observations, patient history, and epidemiological information.  Fact Sheet for Patients:   BoilerBrush.com.cy  Fact Sheet for Healthcare Providers: https://pope.com/  This test is not yet approved or  cleared by the [place] FDA and has been authorized for detection and/or diagnosis of SARS-CoV-2 by FDA under an Emergency Use Authorization (EUA).  This EUA will remain in effect (meaning this test can be used) for the duration of the COVID-19 declaration under Section 564(b)(1) of the Act, 21 U.S.C. section 360bbb-3(b)(1), unless the authorization is terminated or revoked sooner.  Performed  at [facility], [address] [place], [place] [address]   Urine culture     Status: Abnormal   Collection Time: 01/17/20  4:16 PM   Specimen: In/Out Cath Urine  Result Value Ref Range Status   Specimen Description IN/OUT CATH URINE  Final   Special Requests NONE  Final   Culture (A)  Final    30,000 COLONIES/mL GROUP B STREP(S.AGALACTIAE)ISOLATED TESTING AGAINST S. AGALACTIAE NOT ROUTINELY PERFORMED DUE TO PREDICTABILITY OF AMP/PEN/VAN SUSCEPTIBILITY. Performed at [facility], [address], [place], [place] [address]    Report Status 01/18/2020 FINAL  Final  SARS CORONAVIRUS 2 (TAT 6-24 HRS) Nasopharyngeal Nasopharyngeal Swab     Status: None   Collection Time: 01/24/20  4:54 PM   Specimen: Nasopharyngeal Swab  Result Value Ref Range Status   SARS Coronavirus 2 NEGATIVE NEGATIVE Final    Comment: (NOTE) SARS-CoV-2 target nucleic acids are NOT  DETECTED.  The SARS-CoV-2 RNA is generally detectable in upper and lower respiratory specimens during the acute phase of infection. Negative results do not preclude SARS-CoV-2 infection, do not rule out co-infections with other pathogens, and should not be used as the sole basis for treatment or other patient management decisions. Negative results must be combined with clinical observations, patient history, and epidemiological information. The expected result is Negative.  Fact Sheet for Patients: HairSlick.no  Fact Sheet for Healthcare Providers: quierodirigir.com  This test is not yet approved or cleared by the [place] FDA and  has been authorized for detection and/or diagnosis of SARS-CoV-2 by FDA under an Emergency Use Authorization (EUA). This EUA will remain  in effect (meaning this test can be used) for the duration of the COVID-19 declaration under Se ction 564(b)(1) of the Act, 21 U.S.C. section 360bbb-3(b)(1), unless the authorization is terminated  or revoked sooner.  Performed at [facility], [address] [place], [place] [address]      Time coordinating discharge: 35 minutes  SIGNED:   Glade Lloyd, MD  [facility] 01/26/2020, 11:04 AM

## 2020-01-26 NOTE — Progress Notes (Signed)
Patient ID: Dennis Green, male   DOB: [birth year], 54 y.o.   MRN: [id number]  PROGRESS NOTE    Dennis Green  NOB:[id number] DOB: 1966-04-01 DOA: 01/17/2020 PCP: Patient, No Pcp Per   Brief Narrative:  54 year old male with history of hypertension, diabetes mellitus type 2, presented with worsening pain in the right lower extremity for past [redacted] weeks along with bilateral leg edema for a month.  On presentation, CT of the right leg showed features consistent with diffuse cellulitis of the leg.  He was started on IV antibiotics.  Venous duplex of lower extremities was negative for acute DVT.  During the hospitalization, antibiotics were changed to IV Ancef as per ID and subsequently discontinued after completion of course.  He also had scrotal edema and cellulitis for which she was evaluated by urology who recommended medical management.  Assessment & Plan:   Sepsis: Present on admission; sepsis has resolved Right posterior thigh/scrotal cellulitis -Patient was initially started on broad-spectrum antibiotics vancomycin, Flagyl and cefepime.  Urine culture grew group B strep. -ID was consulted who changed antibiotics to IV Ancef.  After patient completed antibiotic course, antibiotics have been discontinued by ID.  Cellulitis is resolved -Venous duplex of lower extremities were negative for DVT  Scrotal edema with cellulitis -Cellulitis has resolved after antibiotic treatment.  Still has significant scrotal edema.  Cirrhosis liver/hypoalbuminemia is probably contributing to the same -Has received intermittent IV Lasix. - will start scheduled intravenous Lasix. -Scrotal ultrasound showed 1 testicle, absent left testicle along with some hydrocele.  Urology recommended medical management.  Outpatient follow-up with urology  Decompensated cirrhosis of liver with thrombocytopenia and anasarca along with hypoalbuminemia -Ultrasound of the abdomen showed cirrhosis of liver.  Patient denies heavy alcohol  use in the past. -Hepatitis panel negative.  HIV nonreactive. -Alpha-1 antitrypsin elevated at 263. - will need outpatient GI evaluation and follow-up: Appointment has been set up with [facility] as an outpatient. -Start scheduled intravenous Lasix.  Might also have to start spironolactone at some point -Strict input and output.  Daily weights.  Fluid restriction.  Hyponatremia -Probably from decompensated liver cirrhosis.  Improving.  Monitor intermittently.  Hypertension -Monitor blood pressure.  Continue Cozaar.  Lasix plan as above.  Diabetes mellitus type 2 uncontrolled with hyperglycemia -A1c 8.8.  Continue Lantus along with CBGs with SSI and NovoLog with meals  Anemia of chronic disease -Hemoglobin stable.  Generalized conditioning -PT recommending SNF.  Social worker following.  DVT prophylaxis: Not on heparin/Lovenox due to?  Thrombocytopenia Code Status: Full Family Communication: None at bedside Disposition Plan: Status is: Inpatient  Remains inpatient appropriate because:Inpatient level of care appropriate due to severity of illness   Dispo: The patient is from: Home              Anticipated d/c is to: SNF              Anticipated d/c date is: 1 day              Patient currently is medically stable to d/c.   Consultants: None  Procedures:  Echo  1. Left ventricular ejection fraction, by estimation, is 60 to 65%. The  left ventricle has normal function. The left ventricle has no regional  wall motion abnormalities. Left ventricular diastolic parameters are  indeterminate.  2. Right ventricular systolic function is normal. The right ventricular  size is normal. Tricuspid regurgitation signal is inadequate for assessing  PA pressure.  3. Left atrial size was severely dilated.  4. The mitral valve is normal in structure. No evidence of mitral valve  regurgitation. No evidence of mitral stenosis.  5. The aortic valve is tricuspid. There is moderate  calcification of the  aortic valve. There is moderate thickening of the aortic valve. Aortic  valve regurgitation is not visualized. No aortic stenosis is present.  6. Aortic dilatation noted. There is mild dilatation of the aortic root,  measuring 40 mm.  7. The inferior vena cava is dilated in size with >50% respiratory  variability, suggesting right atrial pressure of 8 mmHg.  Antimicrobials:  Anti-infectives (From admission, onward)   Start     Dose/Rate Route Frequency Ordered Stop   01/21/20 1800  ceFAZolin (ANCEF) IVPB 1 g/50 mL premix  Status:  Discontinued        1 g 100 mL/hr over 30 Minutes Intravenous Every 8 hours 01/21/20 1636 01/24/20 1348   01/18/20 2200  vancomycin (VANCOREADY) IVPB 1250 mg/250 mL  Status:  Discontinued        1,250 mg 166.7 mL/hr over 90 Minutes Intravenous Every 12 hours 01/18/20 0846 01/21/20 1636   01/18/20 1600  ceFEPIme (MAXIPIME) 2 g in sodium chloride 0.9 % 100 mL IVPB  Status:  Discontinued        2 g 200 mL/hr over 30 Minutes Intravenous Every 8 hours 01/18/20 0842 01/21/20 1636   01/18/20 0630  metroNIDAZOLE (FLAGYL) IVPB 500 mg  Status:  Discontinued        500 mg 100 mL/hr over 60 Minutes Intravenous Every 8 hours 01/18/20 0613 01/21/20 1636   01/18/20 0000  ceFEPIme (MAXIPIME) 1 g in sodium chloride 0.9 % 100 mL IVPB  Status:  Discontinued        1 g 200 mL/hr over 30 Minutes Intravenous Every 8 hours 01/17/20 1735 01/18/20 0842   01/18/20 0000  vancomycin (VANCOREADY) IVPB 1250 mg/250 mL  Status:  Discontinued        1,250 mg 166.7 mL/hr over 90 Minutes Intravenous Every 8 hours 01/17/20 1735 01/18/20 0846   01/17/20 1530  vancomycin (VANCOREADY) IVPB 2000 mg/400 mL        2,000 mg 200 mL/hr over 120 Minutes Intravenous  Once 01/17/20 1444 01/17/20 1724   01/17/20 1445  ceFEPIme (MAXIPIME) 2 g in sodium chloride 0.9 % 100 mL IVPB        2 g 200 mL/hr over 30 Minutes Intravenous  Once 01/17/20 1442 01/17/20 1513   01/17/20 1445   metroNIDAZOLE (FLAGYL) IVPB 500 mg        500 mg 100 mL/hr over 60 Minutes Intravenous  Once 01/17/20 1442 01/17/20 1623   01/17/20 1445  vancomycin (VANCOCIN) IVPB 1000 mg/200 mL premix  Status:  Discontinued        1,000 mg 200 mL/hr over 60 Minutes Intravenous  Once 01/17/20 1442 01/17/20 1444       Subjective: Patient seen and examined at bedside.  Denies worsening abdominal pain, shortness of breath, nausea, vomiting or fever.  Still complains of scrotal swelling.  Objective: Vitals:   01/26/20 0017 01/26/20 0244 01/26/20 0411 01/26/20 0826  BP: (!) 144/72  (!) 146/61 (!) 145/70  Pulse: 67  67 68  Resp: 20  19 20   Temp: 97.7 F (36.5 C)  97.9 F (36.6 C) 99 F (37.2 C)  TempSrc: Oral  Oral Oral  SpO2:  96% 98% 98%  Weight:   133.3 kg   Height:        Intake/Output Summary (Last 24  hours) at 01/26/2020 1045 Last data filed at 01/26/2020 0413 Gross per 24 hour  Intake 480 ml  Output 5025 ml  Net -4545 ml   Filed Weights   01/22/20 0618 01/24/20 0617 01/26/20 0411  Weight: 129.7 kg 133.1 kg 133.3 kg    Examination:  General exam: Appears calm and comfortable.  Looks older than stated age.  Respiratory system: Bilateral decreased breath sounds at bases with scattered crackles Cardiovascular system: S1 & S2 heard, Rate controlled Gastrointestinal system: Abdomen is distended, soft and nontender. Normal bowel sounds heard. Extremities: No cyanosis, clubbing; bilateral lower extremity 2-3+ edema present Genitourinary: Massive scrotal swelling present Central nervous system: Alert and oriented. No focal neurological deficits. Moving extremities Skin: No rashes, lesions or ulcers Psychiatry: Flat affect    Data Reviewed: I have personally reviewed following labs and imaging studies  CBC: Recent Labs  Lab 01/23/20 0135  WBC 5.4  HGB 11.4*  HCT 33.4*  MCV 91.0  PLT 98*   Basic Metabolic Panel: Recent Labs  Lab 01/19/20 1742 01/23/20 0135 01/25/20 0231    NA 129* 128* 131*  K 4.2 3.5 4.1  CL 101 102 102  CO2 21* 19* 22  GLUCOSE 202* 146* 125*  BUN CREATININE 0.80 0.70 0.63  CALCIUM 7.5* 7.0* 7.4*  MG  --  1.7  --    GFR: Estimated Creatinine Clearance: 139.6 mL/min (by C-G formula based on SCr of 0.63 mg/dL). Liver Function Tests: No results for input(s): AST, ALT, ALKPHOS, BILITOT, PROT, ALBUMIN in the last 168 hours. No results for input(s): LIPASE, AMYLASE in the last 168 hours. No results for input(s): AMMONIA in the last 168 hours. Coagulation Profile: No results for input(s): INR, PROTIME in the last 168 hours. Cardiac Enzymes: No results for input(s): CKTOTAL, CKMB, CKMBINDEX, TROPONINI in the last 168 hours. BNP (last 3 results) No results for input(s): PROBNP in the last 8760 hours. HbA1C: No results for input(s): HGBA1C in the last 72 hours. CBG: Recent Labs  Lab 01/25/20 0745 01/25/20 1207 01/25/20 1645 01/25/20 2152 01/26/20 0759  GLUCAP 119* 159* 159* 151* 123*   Lipid Profile: No results for input(s): CHOL, HDL, LDLCALC, TRIG, CHOLHDL, LDLDIRECT in the last 72 hours. Thyroid Function Tests: No results for input(s): TSH, T4TOTAL, FREET4, T3FREE, THYROIDAB in the last 72 hours. Anemia Panel: No results for input(s): VITAMINB12, FOLATE, FERRITIN, TIBC, IRON, RETICCTPCT in the last 72 hours. Sepsis Labs: No results for input(s): PROCALCITON, LATICACIDVEN in the last 168 hours.  Recent Results (from the past 240 hour(s))  Blood Culture (routine x 2)     Status: None   Collection Time: 01/17/20  2:30 PM   Specimen: BLOOD  Result Value Ref Range Status   Specimen Description BLOOD LEFT ANTECUBITAL  Final   Special Requests   Final    BOTTLES DRAWN AEROBIC AND ANAEROBIC Blood Culture results may not be optimal due to an inadequate volume of blood received in culture bottles   Culture   Final    NO GROWTH 5 DAYS Performed at [facility], [address], [place], [place] [address]    Report  Status 01/22/2020 FINAL  Final  Blood Culture (routine x 2)     Status: None   Collection Time: 01/17/20  2:30 PM   Specimen: BLOOD  Result Value Ref Range Status   Specimen Description BLOOD RIGHT ANTECUBITAL  Final   Special Requests   Final    BOTTLES DRAWN AEROBIC AND  ANAEROBIC Blood Culture adequate volume   Culture   Final    NO GROWTH 5 DAYS Performed at [facility], [address] [place], [place] [address]    Report Status 01/22/2020 FINAL  Final  SARS Coronavirus 2 by RT PCR (hospital order, performed in [facility] hospital lab) Nasopharyngeal Nasopharyngeal Swab     Status: None   Collection Time: 01/17/20  3:17 PM   Specimen: Nasopharyngeal Swab  Result Value Ref Range Status   SARS Coronavirus 2 NEGATIVE NEGATIVE Final    Comment: (NOTE) SARS-CoV-2 target nucleic acids are NOT DETECTED.  The SARS-CoV-2 RNA is generally detectable in upper and lower respiratory specimens during the acute phase of infection. The lowest concentration of SARS-CoV-2 viral copies this assay can detect is 250 copies / mL. A negative result does not preclude SARS-CoV-2 infection and should not be used as the sole basis for treatment or other patient management decisions.  A negative result may occur with improper specimen collection / handling, submission of specimen other than nasopharyngeal swab, presence of viral mutation(s) within the areas targeted by this assay, and inadequate number of viral copies (<250 copies / mL). A negative result must be combined with clinical observations, patient history, and epidemiological information.  Fact Sheet for Patients:   BoilerBrush.com.cy  Fact Sheet for Healthcare Providers: https://pope.com/  This test is not yet approved or  cleared by the [place] FDA and has been authorized for detection and/or diagnosis of SARS-CoV-2 by FDA under an Emergency Use Authorization (EUA).  This EUA  will remain in effect (meaning this test can be used) for the duration of the COVID-19 declaration under Section 564(b)(1) of the Act, 21 U.S.C. section 360bbb-3(b)(1), unless the authorization is terminated or revoked sooner.  Performed at [facility], [address], [place], [place] [address]   Urine culture     Status: Abnormal   Collection Time: 01/17/20  4:16 PM   Specimen: In/Out Cath Urine  Result Value Ref Range Status   Specimen Description IN/OUT CATH URINE  Final   Special Requests NONE  Final   Culture (A)  Final    30,000 COLONIES/mL GROUP B STREP(S.AGALACTIAE)ISOLATED TESTING AGAINST S. AGALACTIAE NOT ROUTINELY PERFORMED DUE TO PREDICTABILITY OF AMP/PEN/VAN SUSCEPTIBILITY. Performed at [facility], [address] [place], [place] [address]    Report Status 01/18/2020 FINAL  Final  SARS CORONAVIRUS 2 (TAT 6-24 HRS) Nasopharyngeal Nasopharyngeal Swab     Status: None   Collection Time: 01/24/20  4:54 PM   Specimen: Nasopharyngeal Swab  Result Value Ref Range Status   SARS Coronavirus 2 NEGATIVE NEGATIVE Final    Comment: (NOTE) SARS-CoV-2 target nucleic acids are NOT DETECTED.  The SARS-CoV-2 RNA is generally detectable in upper and lower respiratory specimens during the acute phase of infection. Negative results do not preclude SARS-CoV-2 infection, do not rule out co-infections with other pathogens, and should not be used as the sole basis for treatment or other patient management decisions. Negative results must be combined with clinical observations, patient history, and epidemiological information. The expected result is Negative.  Fact Sheet for Patients: HairSlick.no  Fact Sheet for Healthcare Providers: quierodirigir.com  This test is not yet approved or cleared by the [place] FDA and  has been authorized for detection and/or diagnosis of SARS-CoV-2 by FDA under an Emergency  Use Authorization (EUA). This EUA will remain  in effect (meaning this test can be used) for the duration of the  COVID-19 declaration under Se ction 564(b)(1) of the Act, 21 U.S.C. section 360bbb-3(b)(1), unless the authorization is terminated or revoked sooner.  Performed at [facility], [address] [place], [place] [id number]          Radiology Studies: No results found.      Scheduled Meds: . influenza vac split quadrivalent PF  0.5 mL Intramuscular Tomorrow-1000  . insulin aspart  0-20 Units Subcutaneous TID WC  . insulin aspart  0-5 Units Subcutaneous QHS  . insulin aspart  5 Units Subcutaneous TID WC  . insulin glargine  25 Units Subcutaneous QHS  . losartan  50 mg Oral Daily  . pneumococcal 23 valent vaccine  0.5 mL Intramuscular Tomorrow-1000  . saccharomyces boulardii  250 mg Oral BID   Continuous Infusions:        Dennis LloydKshitiz Somnang Mahan, MD [facility] 01/26/2020, 10:45 AM

## 2020-01-31 ENCOUNTER — Ambulatory Visit (INDEPENDENT_AMBULATORY_CARE_PROVIDER_SITE_OTHER): Payer: Self-pay | Admitting: Primary Care

## 2020-02-22 ENCOUNTER — Telehealth: Payer: Self-pay | Admitting: Physician Assistant

## 2020-02-22 ENCOUNTER — Encounter: Payer: Self-pay | Admitting: Physician Assistant

## 2020-02-22 ENCOUNTER — Ambulatory Visit (INDEPENDENT_AMBULATORY_CARE_PROVIDER_SITE_OTHER): Payer: 59 | Admitting: Physician Assistant

## 2020-02-22 ENCOUNTER — Other Ambulatory Visit (INDEPENDENT_AMBULATORY_CARE_PROVIDER_SITE_OTHER): Payer: 59

## 2020-02-22 VITALS — BP 128/70 | HR 92 | Ht 71.0 in | Wt 242.0 lb

## 2020-02-22 DIAGNOSIS — K7460 Unspecified cirrhosis of liver: Secondary | ICD-10-CM

## 2020-02-22 DIAGNOSIS — Z1211 Encounter for screening for malignant neoplasm of colon: Secondary | ICD-10-CM

## 2020-02-22 LAB — CBC WITH DIFFERENTIAL/PLATELET
Basophils Absolute: 0 10*3/uL (ref 0.0–0.1)
Basophils Relative: 0.6 % (ref 0.0–3.0)
Eosinophils Absolute: 0.2 10*3/uL (ref 0.0–0.7)
Eosinophils Relative: 4.6 % (ref 0.0–5.0)
HCT: 36.6 % — ABNORMAL LOW (ref 39.0–52.0)
Hemoglobin: 12.6 g/dL — ABNORMAL LOW (ref 13.0–17.0)
Lymphocytes Relative: 38.7 % (ref 12.0–46.0)
Lymphs Abs: 1.6 10*3/uL (ref 0.7–4.0)
MCHC: 34.3 g/dL (ref 30.0–36.0)
MCV: 90.6 fl (ref 78.0–100.0)
Monocytes Absolute: 0.5 10*3/uL (ref 0.1–1.0)
Monocytes Relative: 11.9 % (ref 3.0–12.0)
Neutro Abs: 1.9 10*3/uL (ref 1.4–7.7)
Neutrophils Relative %: 44.2 % (ref 43.0–77.0)
Platelets: 83 10*3/uL — ABNORMAL LOW (ref 150.0–400.0)
RBC: 4.04 Mil/uL — ABNORMAL LOW (ref 4.22–5.81)
RDW: 15.8 % — ABNORMAL HIGH (ref 11.5–15.5)
WBC: 4.2 10*3/uL (ref 4.0–10.5)

## 2020-02-22 LAB — COMPREHENSIVE METABOLIC PANEL
ALT: 24 U/L (ref 0–53)
AST: 46 U/L — ABNORMAL HIGH (ref 0–37)
Albumin: 2.8 g/dL — ABNORMAL LOW (ref 3.5–5.2)
Alkaline Phosphatase: 105 U/L (ref 39–117)
BUN: 13 mg/dL (ref 6–23)
CO2: 25 mEq/L (ref 19–32)
Calcium: 8.9 mg/dL (ref 8.4–10.5)
Chloride: 102 mEq/L (ref 96–112)
Creatinine, Ser: 0.74 mg/dL (ref 0.40–1.50)
GFR: 102.57 mL/min (ref >60.00)
Glucose, Bld: 180 mg/dL — ABNORMAL HIGH (ref 70–99)
Potassium: 4.1 mEq/L (ref 3.5–5.1)
Sodium: 134 mEq/L — ABNORMAL LOW (ref 135–145)
Total Bilirubin: 1.9 mg/dL — ABNORMAL HIGH (ref 0.2–1.2)
Total Protein: 7.5 g/dL (ref 6.0–8.3)

## 2020-02-22 LAB — IBC PANEL
Iron: 154 ug/dL (ref 42–165)
Saturation Ratios: 55 % — ABNORMAL HIGH (ref 20.0–50.0)
Transferrin: 200 mg/dL — ABNORMAL LOW (ref 212.0–360.0)

## 2020-02-22 LAB — PROTIME-INR
INR: 1.3 ratio — ABNORMAL HIGH (ref 0.8–1.0)
Prothrombin Time: 14.2 s — ABNORMAL HIGH (ref 9.6–13.1)

## 2020-02-22 LAB — FERRITIN: Ferritin: 176.8 ng/mL (ref 22.0–322.0)

## 2020-02-22 MED ORDER — NA SULFATE-K SULFATE-MG SULF 17.5-3.13-1.6 GM/177ML PO SOLN: 1.0000 | Freq: Once | ORAL | 0 refills | Status: AC

## 2020-02-22 MED ORDER — SPIRONOLACTONE 50 MG PO TABS: 50.0000 mg | ORAL_TABLET | Freq: Every day | ORAL | 3 refills | Status: DC

## 2020-02-22 MED ORDER — SPIRONOLACTONE 50 MG PO TABS
50.0000 mg | ORAL_TABLET | Freq: Every day | ORAL | 3 refills | Status: DC
Start: 2020-02-22 — End: 2021-01-17

## 2020-02-22 MED ORDER — NA SULFATE-K SULFATE-MG SULF 17.5-3.13-1.6 GM/177ML PO SOLN: 1.0000 | Freq: Once | ORAL | 0 refills | Status: DC

## 2020-02-22 NOTE — Patient Instructions (Addendum)
Your provider has requested that you go to the basement level for lab work before leaving today. Press "B" on the elevator. The lab is located at the first door on the left as you exit the elevator.  We have sent the following medications to your pharmacy for you to pick up at your convenience:   Due to recent changes in healthcare laws, you may see the results of your imaging and laboratory studies on MyChart before your provider has had a chance to review them.  We understand that in some cases there may be results that are confusing or concerning to you. Not all laboratory results come back in the same time frame and the provider may be waiting for multiple results in order to interpret others.  Please give [place] 48 hours in order for your provider to thoroughly review all the results before contacting the office for clarification of your results.

## 2020-02-22 NOTE — Telephone Encounter (Signed)
[facility] is requesting for the medications sulfate and aldactone be sent to the [facility] instead.  [facility] tried contacting [facility] to transfer medications but they did not want to comply.  CB [phone] Fax: [phone]

## 2020-02-22 NOTE — Progress Notes (Signed)
Chief Complaint: Cirrhosis  HPI:    [name] is a 54 year old Caucasian male with a past medical history as listed below, who presents to clinic today after being hospitalized for bilateral leg edema and diffuse cellulitis, during which hospital stay they found cirrhosis.    01/17/2020-01/26/2020 patient hospitalized for a month of bilateral leg edema and features consistent with cellulitis, you start on IV antibiotics.  Also had scrotal edema and cellulitis for which he was evaluated by urology.  He was placed in a SNF at discharge.  Patient was also started on Lasix 40 mg twice daily for his scrotal edema and leg edema as well as a finding of cirrhosis.  He had thrombocytopenia and anasarca.  Ultrasound the abdomen showed cirrhosis.  He denied alcohol use.  Hepatitis panel negative, HIV nonreactive, alpha-1 antitrypsin elevated at 263.    Today, the patient presents to clinic and tells me he was never aware of fatty liver or any other problems with his liver until this hospitalization.  He had also had never had problems with swelling of his legs etc.  He denies any heavy alcohol use or other risk factors for cirrhosis other than diabetes.  Tells me that now he is feeling quite well and his legs are "back to normal".  He is using his Lasix 40 mg twice daily.  Denies any abdominal distention.  Overall he is feeling well.    Patient does tell me that he has heartburn 3 to 4 days out of the week and was previously on some Omeprazole he thinks, prior to living in the area.  Currently using Tums/Pepcid with minimal relief.    Also tells me that his last colonoscopy was greater than 10 years ago.  He is not sure where.    Denies fever, chills, blood in his stool, change in bowel habits, abdominal pain, nausea, vomiting or symptoms that awaken him from sleep.  Past Medical History:  Diagnosis Date  . Diabetes mellitus without complication (HCC)   . Hypertension     Past Surgical History:  Procedure  Laterality Date  . CHOLECYSTECTOMY    . VASECTOMY      Current Outpatient Medications  Medication Sig Dispense Refill  . furosemide (LASIX) 40 MG tablet Take 1 tablet (40 mg total) by mouth 2 (two) times daily. 60 tablet 0  . glipiZIDE (GLUCOTROL) 5 MG tablet Take by mouth 2 (two) times daily before a meal.    . losartan (COZAAR) 100 MG tablet Take 50 mg by mouth daily.    . magnesium hydroxide (MILK OF MAGNESIA) 400 MG/5ML suspension Take by mouth daily as needed for mild constipation.    . metFORMIN (GLUCOPHAGE) 1000 MG tablet Take 1,000 mg by mouth 2 (two) times daily with a meal.    . naproxen sodium (ALEVE) 220 MG tablet Take 440 mg by mouth daily as needed (pain).    Marland Kitchen oxycodone-acetaminophen (LYNOX) 5-300 MG tablet Take 1 tablet by mouth every 4 (four) hours as needed for pain.     No current facility-administered medications for this visit.    Allergies as of 02/22/2020 - Review Complete 02/22/2020  Allergen Reaction Noted  . Codeine Hives 01/17/2020  . Hydrochlorothiazide Itching 01/17/2020    Family History  Problem Relation Age of Onset  . Hypertension Mother   . Hypertension Father   . Diabetes Father   . Cancer Sister     Social History   Socioeconomic History  . Marital status: Single  Spouse name: Not on file  . Number of children: Not on file  . Years of education: Not on file  . Highest education level: Not on file  Occupational History  . Not on file  Tobacco Use  . Smoking status: Never Smoker  . Smokeless tobacco: Never Used  Vaping Use  . Vaping Use: Never used  Substance and Sexual Activity  . Alcohol use: Yes    Comment: 2 drinks a year   . Drug use: Never  . Sexual activity: Not on file  Other Topics Concern  . Not on file  Social History Narrative  . Not on file   Social Determinants of Health   Financial Resource Strain:   . Difficulty of Paying Living Expenses: Not on file  Food Insecurity:   . Worried About Brewing technologist in the Last Year: Not on file  . Ran Out of Food in the Last Year: Not on file  Transportation Needs:   . Lack of Transportation (Medical): Not on file  . Lack of Transportation (Non-Medical): Not on file  Physical Activity:   . Days of Exercise per Week: Not on file  . Minutes of Exercise per Session: Not on file  Stress:   . Feeling of Stress : Not on file  Social Connections:   . Frequency of Communication with Friends and Family: Not on file  . Frequency of Social Gatherings with Friends and Family: Not on file  . Attends Religious Services: Not on file  . Active Member of Clubs or Organizations: Not on file  . Attends Banker Meetings: Not on file  . Marital Status: Not on file  Intimate Partner Violence:   . Fear of Current or Ex-Partner: Not on file  . Emotionally Abused: Not on file  . Physically Abused: Not on file  . Sexually Abused: Not on file    Review of Systems:    Constitutional: No weight loss, fever or chills Skin: No itching Cardiovascular: No chest pain Respiratory: No SOB  Gastrointestinal: See HPI and otherwise negative Genitourinary: No dysuria  Neurological: No headache Musculoskeletal: No new muscle or joint pain Hematologic: No bleeding  Psychiatric: No history of depression or anxiety   Physical Exam:  Vital signs: BP 128/70   Pulse 92   Ht 5\' 11"  (1.803 m)   Wt 242 lb (109.8 kg)   BMI 33.75 kg/m   Constitutional:   Pleasant Caucasian male appears to be in NAD, Well developed, Well nourished, alert and cooperative Head:  Normocephalic and atraumatic. Eyes:   PEERL, EOMI. No icterus. Conjunctiva pink. Ears:  Normal auditory acuity. Neck:  Supple Throat: Oral cavity and pharynx without inflammation, swelling or lesion.  Respiratory: Respirations even and unlabored. Lungs clear to auscultation bilaterally.   No wheezes, crackles, or rhonchi.  Cardiovascular: Normal S1, S2. No MRG. Regular rate and rhythm. 1+ b/l LE edema to  level of the shin Gastrointestinal:  Soft, nondistended, nontender. No rebound or guarding. Normal bowel sounds. No appreciable masses or hepatomegaly. Rectal:  Not performed.  Msk:  Symmetrical without gross deformities. Without edema, no deformity or joint abnormality.  Neurologic:  Alert and  oriented x4;  grossly normal neurologically.  Skin:   Dry and intact without significant lesions or rashes. Psychiatric:  Demonstrates good judgement and reason without abnormal affect or behaviors.  RELEVANT LABS AND IMAGING: CBC    Component Value Date/Time   WBC 5.4 01/23/2020 0135   RBC 3.67 (L) 01/23/2020  0135   HGB 11.4 (L) 01/23/2020 0135   HCT 33.4 (L) 01/23/2020 0135   PLT 98 (L) 01/23/2020 0135   MCV 91.0 01/23/2020 0135   MCH 31.1 01/23/2020 0135   MCHC 34.1 01/23/2020 0135   RDW 13.3 01/23/2020 0135   LYMPHSABS 0.7 01/19/2020 1004   MONOABS 0.6 01/19/2020 1004   EOSABS 0.0 01/19/2020 1004   BASOSABS 0.0 01/19/2020 1004    CMP     Component Value Date/Time   NA 132 (L) 01/26/2020 1011   K 4.2 01/26/2020 1011   CL 101 01/26/2020 1011   CO2 24 01/26/2020 1011   GLUCOSE 173 (H) 01/26/2020 1011   BUN 8 01/26/2020 1011   CREATININE 0.67 01/26/2020 1011   CALCIUM 7.7 (L) 01/26/2020 1011   PROT 5.7 (L) 01/19/2020 1004   ALBUMIN 1.9 (L) 01/19/2020 1004   AST 34 01/19/2020 1004   ALT 16 01/19/2020 1004   ALKPHOS 82 01/19/2020 1004   BILITOT 2.2 (H) 01/19/2020 1004   GFRNONAA >60 01/26/2020 1011   GFRAA >60 01/26/2020 1011    Assessment: 1.  Cirrhosis with bilateral lower extremity swelling: New diagnosis during hospitalization with uncertain etiology 2.  Screening for colorectal cancer: Patient reports colonoscopy greater than 10 years ago 3.  Heartburn  Plan: 1.  Reviewed the diagnosis of cirrhosis with the patient and tried to answer questions. 2.  Scheduled the patient for variceal screening with an EGD and colorectal cancer screening with a colonoscopy in the [facility]  with Dr. Lavon Paganini.  Did discuss risks, benefits, limitations and alternatives and the patient agrees to proceed.  He has had his Covid vaccines. 3.  Patient will continue Lasix 40 mg twice daily as currently prescribed. 4.  Started the patient on Spironolactone 50 mg daily #30 with 2 refills. 5.  Ordered further labs today to include a CBC, CMP, PT/INR, iron studies, AFP, alpha-1 antitrypsin, AMA, ANA, ASMA, ceruloplasmin, hepatitis screen and TTG. 6.  Also schedule patient for repeat CMP in 2 weeks given that we are increasing his Spironolactone. 7.  Start the patient on omeprazole 20 mg daily #30 with 3 refills. 8.  Patient to follow in clinic per recommendations from Dr. Lavon Paganini after time of procedures.  Hyacinth Meeker, PA-C [facility] 02/22/2020, 9:58 AM

## 2020-02-24 NOTE — Progress Notes (Signed)
Reviewed and agree with documentation and assessment and plan. K. Veena Miasha Emmons , MD   

## 2020-02-25 LAB — IGA: Immunoglobulin A: 1510 mg/dL — ABNORMAL HIGH (ref 47–310)

## 2020-02-25 LAB — HEPATITIS C ANTIBODY
Hepatitis C Ab: NONREACTIVE
SIGNAL TO CUT-OFF: 0.18 (ref <1.00)

## 2020-02-25 LAB — HEPATITIS B SURFACE ANTIBODY,QUALITATIVE: Hep B S Ab: NONREACTIVE

## 2020-02-25 LAB — TISSUE TRANSGLUTAMINASE, IGA: (tTG) Ab, IgA: <1 U/mL

## 2020-02-25 LAB — AFP TUMOR MARKER: AFP-Tumor Marker: 2.2 ng/mL (ref <6.1)

## 2020-02-25 LAB — HEPATITIS A ANTIBODY, TOTAL: Hepatitis A AB,Total: NONREACTIVE

## 2020-02-25 LAB — MITOCHONDRIAL ANTIBODIES: Mitochondrial M2 Ab, IgG: <=20 U

## 2020-02-25 LAB — CERULOPLASMIN: Ceruloplasmin: 29 mg/dL (ref 18–36)

## 2020-02-25 LAB — ALPHA-1-ANTITRYPSIN: A-1 Antitrypsin, Ser: 167 mg/dL (ref 83–199)

## 2020-02-25 LAB — HEPATITIS B SURFACE ANTIGEN: Hepatitis B Surface Ag: NONREACTIVE

## 2020-02-25 LAB — ANA: Anti Nuclear Antibody (ANA): NEGATIVE

## 2020-03-03 ENCOUNTER — Other Ambulatory Visit: Payer: Self-pay

## 2020-03-03 DIAGNOSIS — K7460 Unspecified cirrhosis of liver: Secondary | ICD-10-CM

## 2020-04-12 ENCOUNTER — Encounter: Payer: 59 | Admitting: Gastroenterology

## 2020-06-05 ENCOUNTER — Other Ambulatory Visit: Payer: Self-pay

## 2020-06-05 DIAGNOSIS — K7460 Unspecified cirrhosis of liver: Secondary | ICD-10-CM

## 2020-08-30 ENCOUNTER — Other Ambulatory Visit: Payer: Self-pay | Admitting: Physician Assistant

## 2020-08-30 DIAGNOSIS — K7460 Unspecified cirrhosis of liver: Secondary | ICD-10-CM

## 2021-01-16 ENCOUNTER — Ambulatory Visit: Payer: 59 | Admitting: Gastroenterology

## 2021-01-17 ENCOUNTER — Ambulatory Visit (INDEPENDENT_AMBULATORY_CARE_PROVIDER_SITE_OTHER): Payer: 59 | Admitting: Physician Assistant

## 2021-01-17 ENCOUNTER — Other Ambulatory Visit (INDEPENDENT_AMBULATORY_CARE_PROVIDER_SITE_OTHER): Payer: 59

## 2021-01-17 ENCOUNTER — Encounter: Payer: Self-pay | Admitting: Physician Assistant

## 2021-01-17 VITALS — BP 116/70 | HR 67 | Ht 71.0 in | Wt 261.1 lb

## 2021-01-17 DIAGNOSIS — R195 Other fecal abnormalities: Secondary | ICD-10-CM

## 2021-01-17 DIAGNOSIS — K7460 Unspecified cirrhosis of liver: Secondary | ICD-10-CM | POA: Diagnosis not present

## 2021-01-17 DIAGNOSIS — D649 Anemia, unspecified: Secondary | ICD-10-CM

## 2021-01-17 LAB — CBC WITH DIFFERENTIAL/PLATELET
Basophils Absolute: 0 10*3/uL (ref 0.0–0.1)
Basophils Relative: 0.6 % (ref 0.0–3.0)
Eosinophils Absolute: 0.3 10*3/uL (ref 0.0–0.7)
Eosinophils Relative: 9.5 % — ABNORMAL HIGH (ref 0.0–5.0)
HCT: 32.5 % — ABNORMAL LOW (ref 39.0–52.0)
Hemoglobin: 11.4 g/dL — ABNORMAL LOW (ref 13.0–17.0)
Lymphocytes Relative: 29.8 % (ref 12.0–46.0)
Lymphs Abs: 1.1 10*3/uL (ref 0.7–4.0)
MCHC: 35.1 g/dL (ref 30.0–36.0)
MCV: 88.3 fl (ref 78.0–100.0)
Monocytes Absolute: 0.3 10*3/uL (ref 0.1–1.0)
Monocytes Relative: 8.2 % (ref 3.0–12.0)
Neutro Abs: 1.9 10*3/uL (ref 1.4–7.7)
Neutrophils Relative %: 51.9 % (ref 43.0–77.0)
Platelets: 70 10*3/uL — ABNORMAL LOW (ref 150.0–400.0)
RBC: 3.68 Mil/uL — ABNORMAL LOW (ref 4.22–5.81)
RDW: 13.5 % (ref 11.5–15.5)
WBC: 3.6 10*3/uL — ABNORMAL LOW (ref 4.0–10.5)

## 2021-01-17 LAB — IBC + FERRITIN
Ferritin: 60.2 ng/mL (ref 22.0–322.0)
Iron: 84 ug/dL (ref 42–165)
Saturation Ratios: 25.6 % (ref 20.0–50.0)
TIBC: 327.6 ug/dL (ref 250.0–450.0)
Transferrin: 234 mg/dL (ref 212.0–360.0)

## 2021-01-17 LAB — COMPREHENSIVE METABOLIC PANEL
ALT: 13 U/L (ref 0–53)
AST: 21 U/L (ref 0–37)
Albumin: 3.3 g/dL — ABNORMAL LOW (ref 3.5–5.2)
Alkaline Phosphatase: 110 U/L (ref 39–117)
BUN: 22 mg/dL (ref 6–23)
CO2: 20 mEq/L (ref 19–32)
Calcium: 8.4 mg/dL (ref 8.4–10.5)
Chloride: 106 mEq/L (ref 96–112)
Creatinine, Ser: 1.28 mg/dL (ref 0.40–1.50)
GFR: 62.06 mL/min (ref >60.00)
Glucose, Bld: 357 mg/dL — ABNORMAL HIGH (ref 70–99)
Potassium: 3.9 mEq/L (ref 3.5–5.1)
Sodium: 136 mEq/L (ref 135–145)
Total Bilirubin: 0.9 mg/dL (ref 0.2–1.2)
Total Protein: 7 g/dL (ref 6.0–8.3)

## 2021-01-17 MED ORDER — NA SULFATE-K SULFATE-MG SULF 17.5-3.13-1.6 GM/177ML PO SOLN: 1.0000 | Freq: Once | ORAL | 0 refills | Status: AC

## 2021-01-17 NOTE — Patient Instructions (Signed)
Your provider has requested that you go to the basement level for lab work before leaving today. Press "B" on the elevator. The lab is located at the first door on the left as you exit the elevator.  You have been scheduled for an endoscopy and colonoscopy. Please follow the written instructions given to you at your visit today. Please pick up your prep supplies at the pharmacy within the next 1-3 days. If you use inhalers (even only as needed), please bring them with you on the day of your procedure.  If you are age 14 or older, your body mass index should be between 23-30. Your Body mass index is 36.42 kg/m. If this is out of the aforementioned range listed, please consider follow up with your Primary Care Provider.  If you are age 57 or younger, your body mass index should be between 19-25. Your Body mass index is 36.42 kg/m. If this is out of the aformentioned range listed, please consider follow up with your Primary Care Provider.   __________________________________________________________  The [facility] providers would like to encourage you to use [facility] to communicate with providers for non-urgent requests or questions.  Due to long hold times on the telephone, sending your provider a message by [facility] may be a faster and more efficient way to get a response.  Please allow 48 business hours for a response.  Please remember that this is for non-urgent requests.

## 2021-01-17 NOTE — Progress Notes (Signed)
Chief Complaint: Anemia, Hemoccult positive stool  HPI:    Dennis Green is a 55 year old male with a past medical history of cirrhosis, diabetes (01/18/2020 echo with LVEF 60-65% and no aortic stenosis) and others listed below, assigned a Dr. Lavon Green at last visit, who presents clinic today with a complaint of anemia and Hemoccult positive stool.    02/22/2020 office visit with me for cirrhosis incidentally found on imaging during recent hospitalization.  At that time further liver work-up was completed and negative.  It was thought that cirrhosis was related to progression of fatty liver.  He was told to have an EGD for variceal screening and a colonoscopy which he never scheduled.  He is on Lasix and spironolactone.  Also started on omeprazole 20 mg daily.    06/23/2020 CBC with hemoglobin of 7.8, platelets 105.    Today, the patient tells me that he was in the hospital from January until the end of February with cellulitis and COVID among other things.  He was found to be anemic during that stay and recently had follow-up labs in March as well as a Hemoccult study at his PCPs office, per his report (we do not have records), which was positive.  He explains that he has not been seeing any blood in his stool and has remained fairly consistent with his bowel movements.  Tells me he will occasionally have some generalized abdominal cramping but it does not last long as well as some heartburn occasionally.  Otherwise denies GI complaints or concerns.  Tells me he was aware he was supposed to have an EGD and colonoscopy after last visit but never had these done.    Denies fever, chills, weight loss, nausea, vomiting or symptoms that awaken him from sleep.  Past Medical History:  Diagnosis Date   Diabetes mellitus without complication (HCC)    Hypertension     Past Surgical History:  Procedure Laterality Date   CHOLECYSTECTOMY     VASECTOMY      Current Outpatient Medications  Medication Sig  Dispense Refill   albuterol (PROVENTIL) (2.5 MG/3ML) 0.083% nebulizer solution Take 2.5 mg by nebulization every 6 (six) hours as needed for wheezing or shortness of breath.     amLODipine-atorvastatin (CADUET) 5-10 MG tablet Take 1 tablet by mouth daily.     diclofenac Sodium (VOLTAREN) 1 % GEL Apply topically 4 (four) times daily.     doxycycline (VIBRAMYCIN) 100 MG capsule Take 100 mg by mouth 2 (two) times daily.     FLUoxetine (PROZAC) 10 MG capsule Take 10 mg by mouth daily.     glipiZIDE (GLUCOTROL) 5 MG tablet Take by mouth once.     losartan (COZAAR) 100 MG tablet Take 50 mg by mouth daily.     mupirocin ointment (BACTROBAN) 2 % 1 application 2 (two) times daily.     No current facility-administered medications for this visit.    Allergies as of 01/17/2021 - Review Complete 01/17/2021  Allergen Reaction Noted   Codeine Hives and Swelling 01/17/2020   Hydrochlorothiazide Itching 01/17/2020    Family History  Problem Relation Age of Onset   Hypertension Mother    Hypertension Father    Diabetes Father    Cancer Sister     Social History   Socioeconomic History   Marital status: Single    Spouse name: Not on file   Number of children: Not on file   Years of education: Not on file   Highest education level: Not  on file  Occupational History   Not on file  Tobacco Use   Smoking status: Never   Smokeless tobacco: Never  Vaping Use   Vaping Use: Never used  Substance and Sexual Activity   Alcohol use: Yes    Comment: 2 drinks a year    Drug use: Never   Sexual activity: Not on file  Other Topics Concern   Not on file  Social History Narrative   Not on file   Social Determinants of Health   Financial Resource Strain: Not on file  Food Insecurity: Not on file  Transportation Needs: Not on file  Physical Activity: Not on file  Stress: Not on file  Social Connections: Not on file  Intimate Partner Violence: Not on file    Review of Systems:     Constitutional: No weight loss, fever or chills Cardiovascular: No chest pain Respiratory: No SOB Gastrointestinal: See HPI and otherwise negative   Physical Exam:  Vital signs: BP 116/70   Pulse 67   Ht 5\' 11"  (1.803 m)   Wt 261 lb 2 oz (118.4 kg)   BMI 36.42 kg/m   Constitutional:   Pleasant overweight Caucasian male appears to be in NAD, Well developed, Well nourished, alert and cooperative Respiratory: Respirations even and unlabored. Lungs clear to auscultation bilaterally.   No wheezes, crackles, or rhonchi.  Cardiovascular: Normal S1, S2. No MRG. Regular rate and rhythm. No peripheral edema, cyanosis or pallor.  Gastrointestinal:  Soft, nondistended, nontender. No rebound or guarding. Normal bowel sounds. No appreciable masses or hepatomegaly. Rectal:  Not performed.  Psychiatric: Demonstrates good judgement and reason without abnormal affect or behaviors.  See HPI for recent labs.  Assessment: 1.  Anemia: Found during recent hospitalization, apparently Hemoccult positive at his PCPs office; consider GI source versus other 2.  Cirrhosis: Previous work-up negative, etiology believed from fatty liver  Plan: 1.  Scheduled patient for diagnostic EGD and colonoscopy in the [facility] with Dr. .  Her next available appointment was in a month from now.  Did discuss risks, benefits, limitations and alternatives and the patient agrees to proceed. Patient is appropriate for endoscopic procedure(s) in the ambulatory ([facility]) setting.  2.  Ordered CBC, CMP, PT and INR today as well as a studies.  If these labs show worsening/severe anemia may need to expedite procedures. 3.  Patient to follow in clinic per recommendations after labs and procedures above.  Dennis Paganini, PA-C [name] 01/17/2021, 11:02 AM  Cc: No ref. provider found

## 2021-01-23 ENCOUNTER — Telehealth: Payer: Self-pay

## 2021-01-23 DIAGNOSIS — K7460 Unspecified cirrhosis of liver: Secondary | ICD-10-CM

## 2021-01-23 DIAGNOSIS — D649 Anemia, unspecified: Secondary | ICD-10-CM

## 2021-01-23 NOTE — Telephone Encounter (Signed)
Received call from lab that patient has not returned for the INR/PT despite him being reminded by lab. All other test results are being help up until this order is completed or canceled. Please advise on how you would like to proceed.

## 2021-01-26 NOTE — Telephone Encounter (Signed)
Unk Lightning, [place] to Me     11:46 AM Thanks for following up on this,   It looks like patient is scheduled for procedures 10/21 with Dr. Lavon Paganini.  Looking at his hemoglobin it did drop a point from last check.  Would recommend that we recheck a CBC/CMP and can then check his PT/INR closer to procedure time, so repeat in about 3 weeks.  That way the patient does not have to come back in right now for labs. (Can cancel current order if it is holding things up)   Thanks, JL L.

## 2021-01-26 NOTE — Telephone Encounter (Signed)
Orders placed. Patient made aware.

## 2021-01-29 NOTE — Progress Notes (Signed)
Reviewed and agree with documentation and assessment and plan. K. Veena Sevin Langenbach , MD   

## 2021-02-07 ENCOUNTER — Other Ambulatory Visit (INDEPENDENT_AMBULATORY_CARE_PROVIDER_SITE_OTHER): Payer: 59

## 2021-02-07 ENCOUNTER — Encounter: Payer: Self-pay | Admitting: Internal Medicine

## 2021-02-07 DIAGNOSIS — D649 Anemia, unspecified: Secondary | ICD-10-CM | POA: Diagnosis not present

## 2021-02-07 DIAGNOSIS — K7460 Unspecified cirrhosis of liver: Secondary | ICD-10-CM

## 2021-02-07 LAB — COMPREHENSIVE METABOLIC PANEL
ALT: 15 U/L (ref 0–53)
AST: 24 U/L (ref 0–37)
Albumin: 3.2 g/dL — ABNORMAL LOW (ref 3.5–5.2)
Alkaline Phosphatase: 123 U/L — ABNORMAL HIGH (ref 39–117)
BUN: 21 mg/dL (ref 6–23)
CO2: 21 mEq/L (ref 19–32)
Calcium: 8.4 mg/dL (ref 8.4–10.5)
Chloride: 98 mEq/L (ref 96–112)
Creatinine, Ser: 1.65 mg/dL — ABNORMAL HIGH (ref 0.40–1.50)
GFR: 45.74 mL/min — ABNORMAL LOW (ref >60.00)
Glucose, Bld: 619 mg/dL (ref 70–99)
Potassium: 4.2 mEq/L (ref 3.5–5.1)
Sodium: 128 mEq/L — ABNORMAL LOW (ref 135–145)
Total Bilirubin: 1.3 mg/dL — ABNORMAL HIGH (ref 0.2–1.2)
Total Protein: 7 g/dL (ref 6.0–8.3)

## 2021-02-07 LAB — CBC
HCT: 35 % — ABNORMAL LOW (ref 39.0–52.0)
Hemoglobin: 12.1 g/dL — ABNORMAL LOW (ref 13.0–17.0)
MCHC: 34.5 g/dL (ref 30.0–36.0)
MCV: 87.9 fl (ref 78.0–100.0)
Platelets: 64 10*3/uL — ABNORMAL LOW (ref 150.0–400.0)
RBC: 3.98 Mil/uL — ABNORMAL LOW (ref 4.22–5.81)
RDW: 13 % (ref 11.5–15.5)
WBC: 3.1 10*3/uL — ABNORMAL LOW (ref 4.0–10.5)

## 2021-02-07 LAB — PROTIME-INR
INR: 1.2 ratio — ABNORMAL HIGH (ref 0.8–1.0)
Prothrombin Time: 12.6 s (ref 9.6–13.1)

## 2021-02-07 NOTE — Progress Notes (Signed)
Received a critical glucose result of over 600.  Called the patient he said he was feeling somewhat lightheaded and dizzy.  He was driving in a car as a passenger with a friend.  I recommended and he understood to go to an emergency department for treatment.

## 2021-02-08 ENCOUNTER — Telehealth: Payer: Self-pay

## 2021-02-08 NOTE — Telephone Encounter (Signed)
Letter has been composed. Discharge form is completed. Both pages need to be signed. I have placed them on your desk for review. I will place letter in the mail once signed. Thank you

## 2021-02-08 NOTE — Telephone Encounter (Signed)
-----   Message from Napoleon Form, MD sent at 02/08/2021 12:15 PM EDT ----- We will have to discharge patient from [facility] for repeated non compliance. We will have to send him a letter.  Thanks VN

## 2021-02-09 NOTE — Telephone Encounter (Signed)
Ok, thanks.

## 2021-02-09 NOTE — Telephone Encounter (Signed)
Both pages have been signed. Forms have been given to Meta to send out.

## 2021-02-14 ENCOUNTER — Telehealth: Payer: Self-pay | Admitting: Gastroenterology

## 2021-02-14 NOTE — Telephone Encounter (Signed)
Patient dismissed from [facility] by Marsa Aris, MD, effective 02/08/21. Dismissal Letter sent out by 1st class mail. [employer]

## 2021-02-23 ENCOUNTER — Encounter: Payer: 59 | Admitting: Gastroenterology

## 2021-03-19 ENCOUNTER — Ambulatory Visit (INDEPENDENT_AMBULATORY_CARE_PROVIDER_SITE_OTHER): Payer: 59 | Admitting: Podiatry

## 2021-03-19 ENCOUNTER — Other Ambulatory Visit: Payer: Self-pay

## 2021-03-19 ENCOUNTER — Encounter: Payer: Self-pay | Admitting: Podiatry

## 2021-03-19 DIAGNOSIS — B351 Tinea unguium: Secondary | ICD-10-CM

## 2021-03-19 DIAGNOSIS — M79674 Pain in right toe(s): Secondary | ICD-10-CM | POA: Diagnosis not present

## 2021-03-19 DIAGNOSIS — E1140 Type 2 diabetes mellitus with diabetic neuropathy, unspecified: Secondary | ICD-10-CM

## 2021-03-19 DIAGNOSIS — M79675 Pain in left toe(s): Secondary | ICD-10-CM

## 2021-03-19 DIAGNOSIS — E1149 Type 2 diabetes mellitus with other diabetic neurological complication: Secondary | ICD-10-CM | POA: Diagnosis not present

## 2021-03-19 NOTE — Progress Notes (Signed)
Subjective:   Patient ID: Dennis Green, male   DOB: 55 y.o.   MRN: [id number]   HPI Patient presents with elongated nailbeds 1-5 both feet and nails on the right over left which become very thickened and incurvated in the corners and bothersome to him.  States that he is concerned about fungus has diabetes and cannot cut his nails due to his diabetes and health issues.  Patient does not smoke is not active   Review of Systems  All other systems reviewed and are negative.      Objective:  Physical Exam Vitals and nursing note reviewed.  Constitutional:      Appearance: He is well-developed.  Pulmonary:     Effort: Pulmonary effort is normal.  Musculoskeletal:        General: Normal range of motion.  Skin:    General: Skin is warm.  Neurological:     Mental Status: He is alert.    Neurovascular status reveals diminished sharp dull vibratory bilateral with vascular status intact.  Patient is found to have significant thickness of nailbeds 1-5 right and all other nailbeds are moderately incurvated thickened with discoloration.  Patient has diabetes not in good control with A1c running over 10 and recently 9.  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  Today I went ahead debrided nailbeds 1-5 both feet no iatrogenic bleeding reappoint routine care

## 2021-06-06 ENCOUNTER — Ambulatory Visit: Payer: 59 | Admitting: Family Medicine

## 2021-06-20 ENCOUNTER — Ambulatory Visit: Payer: Self-pay | Admitting: Podiatry

## 2021-06-20 DIAGNOSIS — M79676 Pain in unspecified toe(s): Secondary | ICD-10-CM

## 2021-07-02 ENCOUNTER — Ambulatory Visit: Payer: 59 | Admitting: Podiatry

## 2021-07-02 ENCOUNTER — Other Ambulatory Visit: Payer: Self-pay

## 2021-07-02 ENCOUNTER — Encounter: Payer: Self-pay | Admitting: Podiatry

## 2021-07-02 DIAGNOSIS — E1149 Type 2 diabetes mellitus with other diabetic neurological complication: Secondary | ICD-10-CM | POA: Diagnosis not present

## 2021-07-02 DIAGNOSIS — E1140 Type 2 diabetes mellitus with diabetic neuropathy, unspecified: Secondary | ICD-10-CM

## 2021-07-02 DIAGNOSIS — B351 Tinea unguium: Secondary | ICD-10-CM | POA: Diagnosis not present

## 2021-07-02 DIAGNOSIS — M79675 Pain in left toe(s): Secondary | ICD-10-CM | POA: Diagnosis not present

## 2021-07-02 DIAGNOSIS — M79674 Pain in right toe(s): Secondary | ICD-10-CM

## 2021-07-02 NOTE — Progress Notes (Signed)
This patient returns to my office for at risk foot care.  This patient requires this care by a professional since this patient will be at risk due to having diabetes.  This patient is unable to cut nails himself since the patient cannot reach his nails.These nails are painful walking and wearing shoes.  This patient presents for at risk foot care today.  General Appearance  Alert, conversant and in no acute stress.  Vascular  Dorsalis pedis and posterior tibial  pulses are palpable  bilaterally.  Capillary return is within normal limits  bilaterally. Temperature is within normal limits  bilaterally.  Neurologic  Senn-Weinstein monofilament wire test within normal limits  bilaterally. Muscle power within normal limits bilaterally.  Nails Thick disfigured discolored nails with subungual debris  from hallux to fifth toes bilaterally. No evidence of bacterial infection or drainage bilaterally.  Pincer hallux nails  B/l.  Orthopedic  No limitations of motion  feet .  No crepitus or effusions noted.  No bony pathology or digital deformities noted.  Skin  normotropic skin with no porokeratosis noted bilaterally.  No signs of infections or ulcers noted.     Onychomycosis  Pain in right toes  Pain in left toes  Consent was obtained for treatment procedures.   Mechanical debridement of nails 1-5  bilaterally performed with a nail nipper.  Filed with dremel without incident.    Return office visit   3 months                   Told patient to return for periodic foot care and evaluation due to potential at risk complications.   Gardiner Barefoot DPM

## 2021-07-06 IMAGING — DX DG CHEST 1V PORT
2 series · 2 of 2 positions shown · non-contrast
Comparison: None.

CLINICAL DATA: Questionable sepsis

EXAM:
PORTABLE CHEST 1 VIEW

[chest ap (1 of 2)]
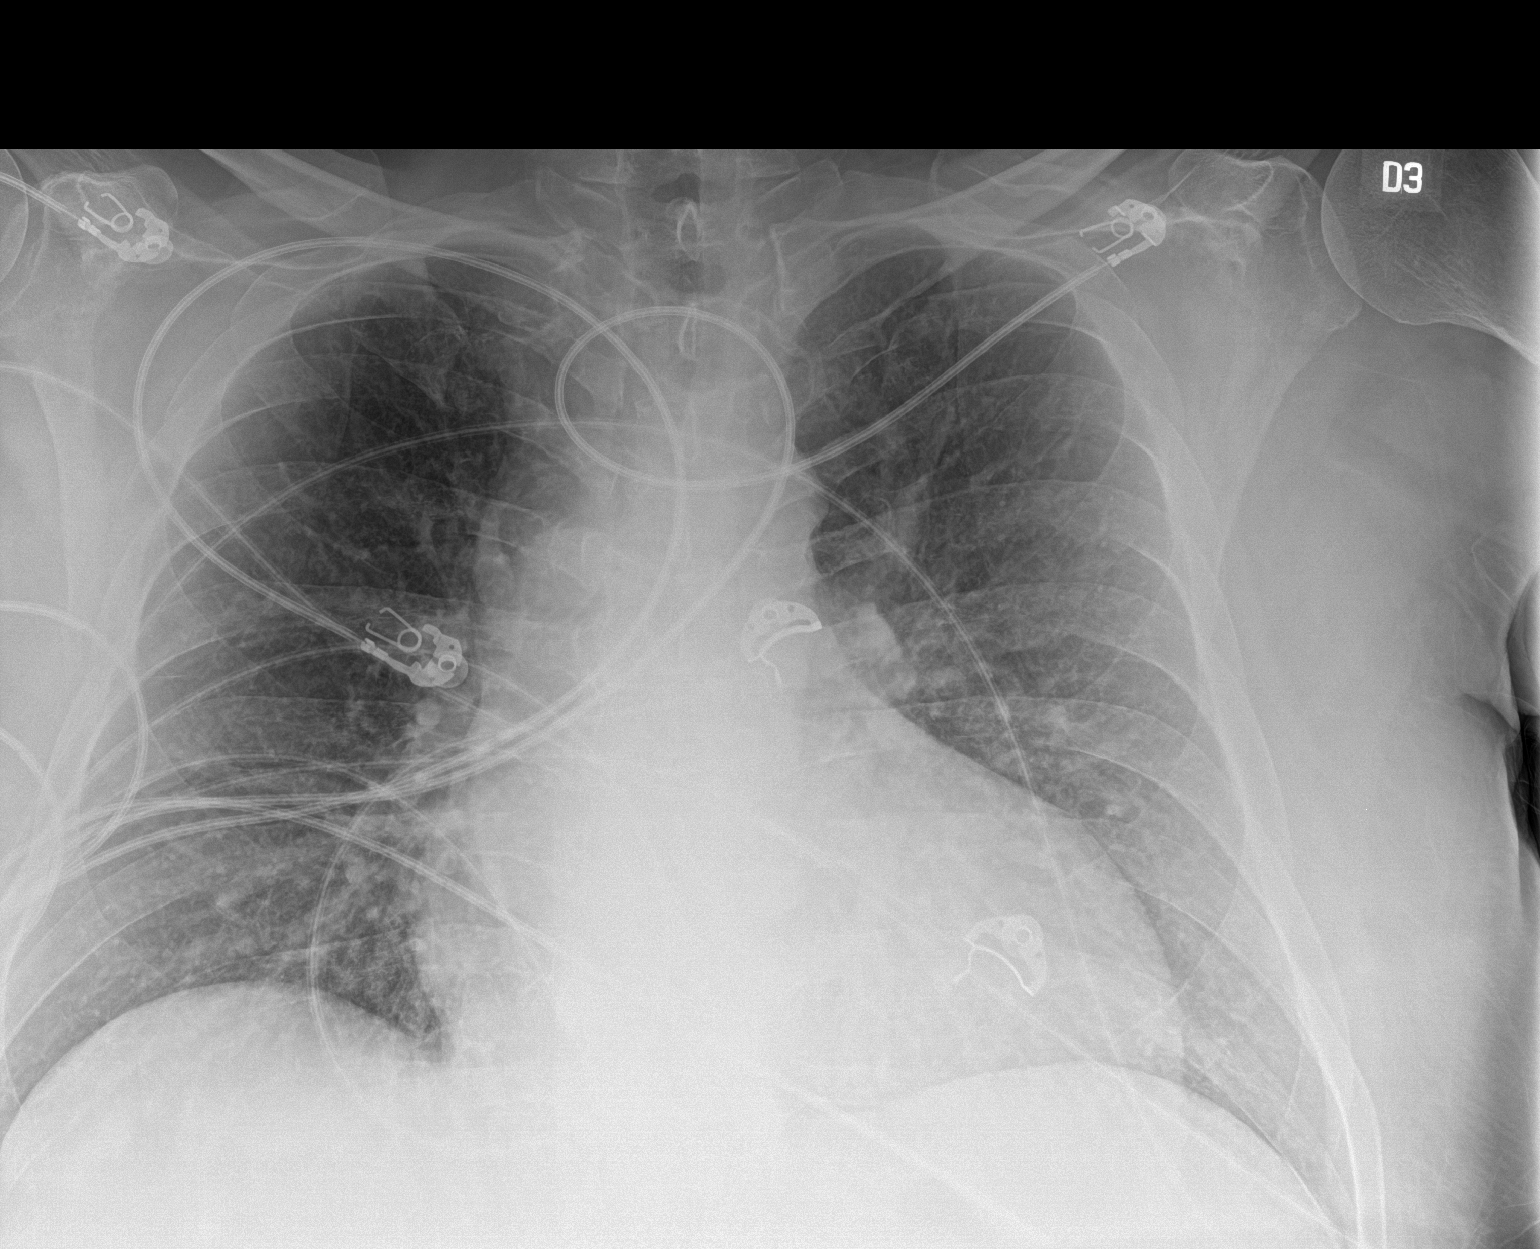

[chest ap (2 of 2)]
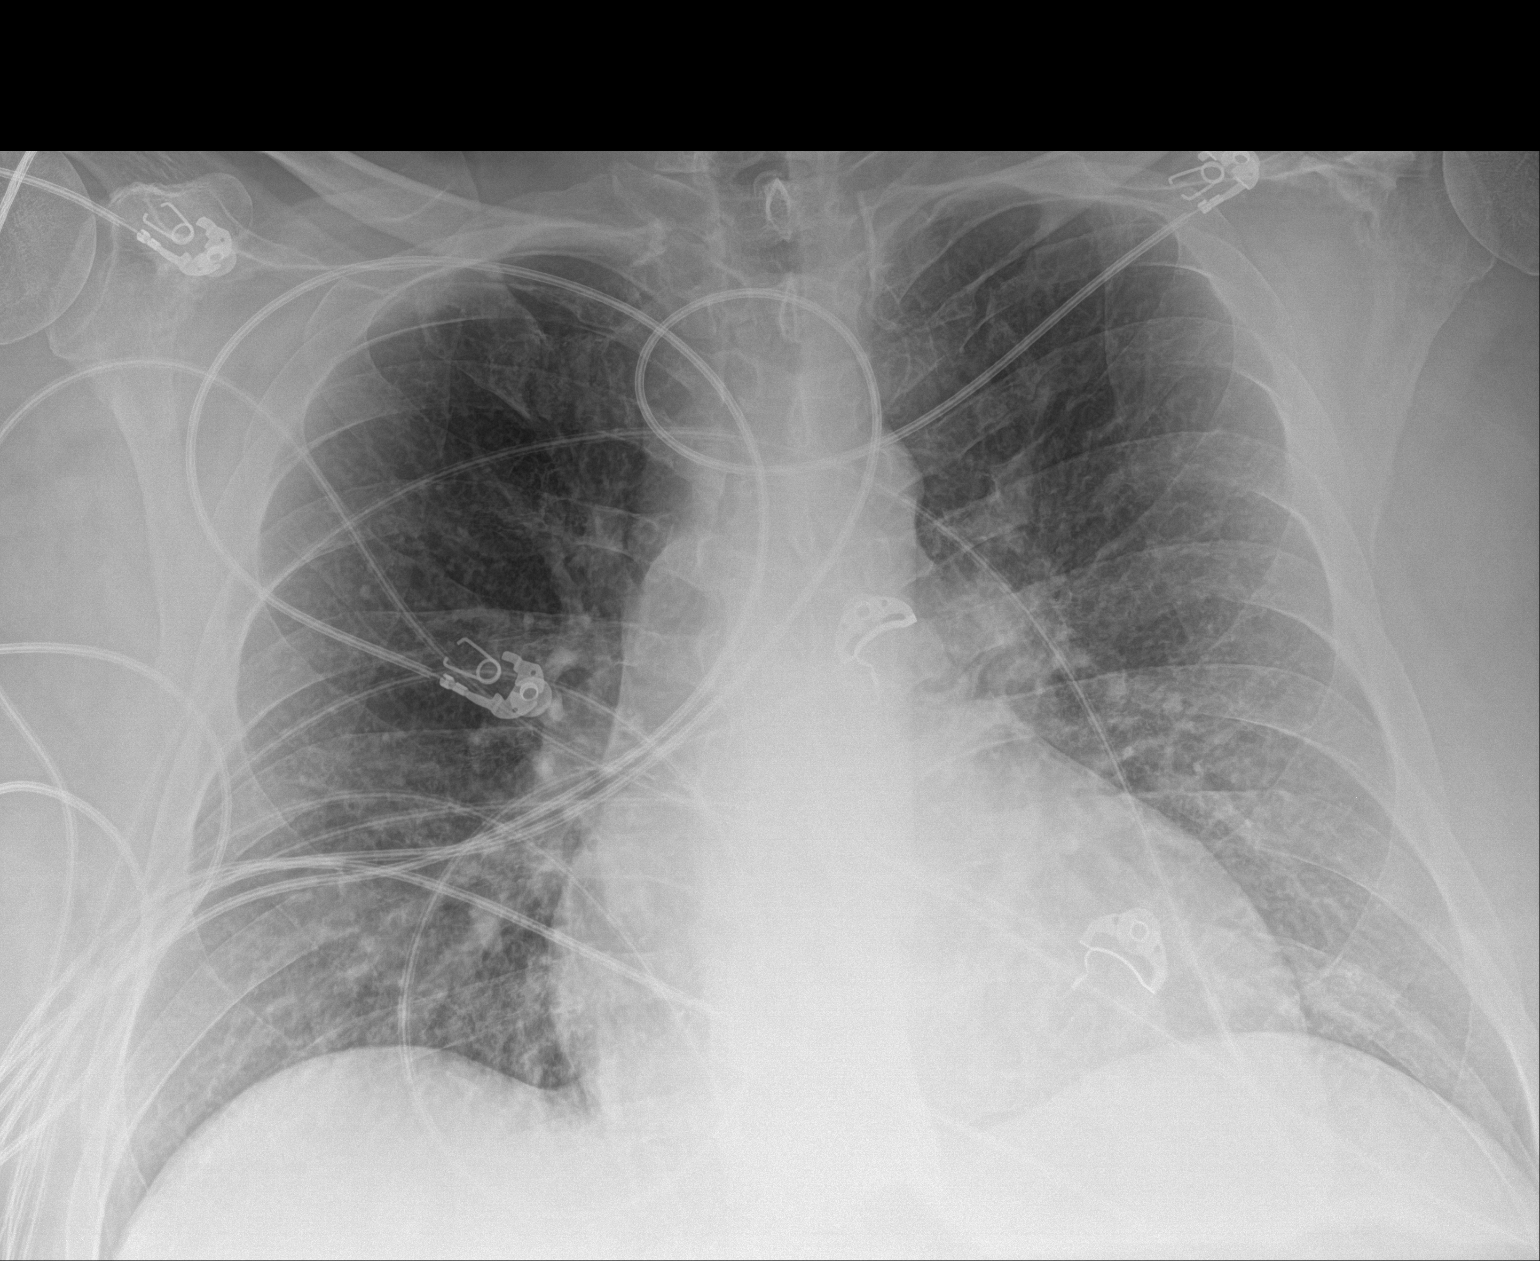

[2 of 2 positions shown; findings below may reference images not displayed]

FINDINGS: The heart size and mediastinal contours are within normal limits.
There are mild to moderate diffuse bilateral interstitial opacities.
No pleural effusion or pneumothorax. The visualized skeletal
structures are unremarkable.
IMPRESSION: Mild to moderate diffuse bilateral interstitial opacities, which may
reflect pulmonary edema or atypical infection.

## 2021-07-07 IMAGING — US US ABDOMEN LIMITED
1 series · 14 of 25 positions shown · non-contrast
Comparison: None

CLINICAL DATA: Diabetes mellitus, hypertension, elevated bilirubin,
question cirrhosis

EXAM:
ULTRASOUND ABDOMEN LIMITED RIGHT UPPER QUADRANT

[Series 1: us abdomen limited · 14 of 28 slices shown]
[im 1/28]
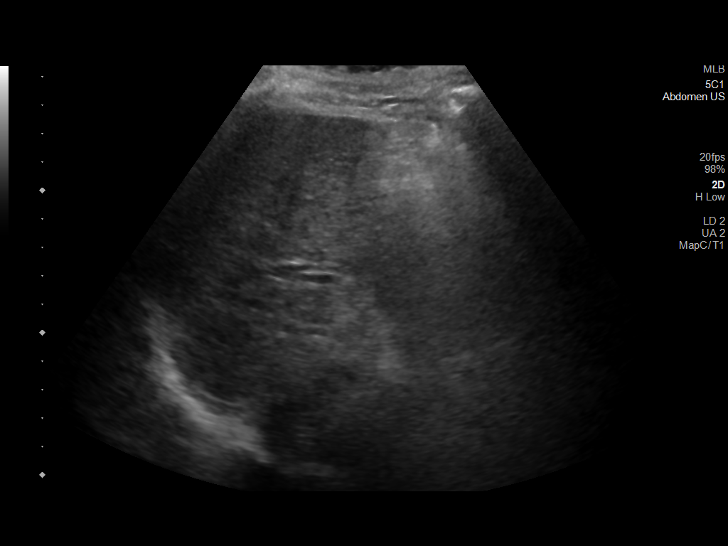
[im 3/28]
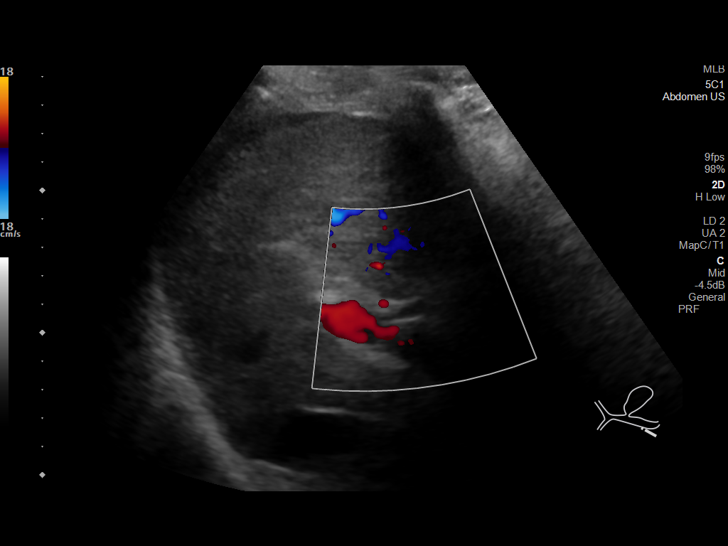
[im 5/28]
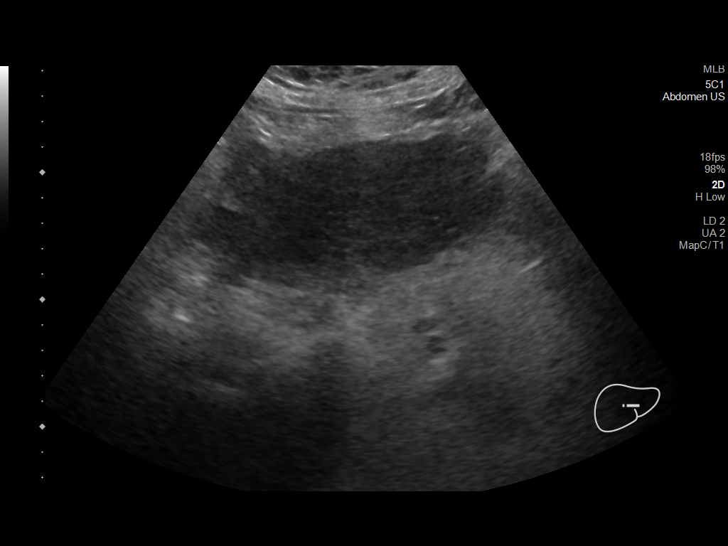
[im 7/28]
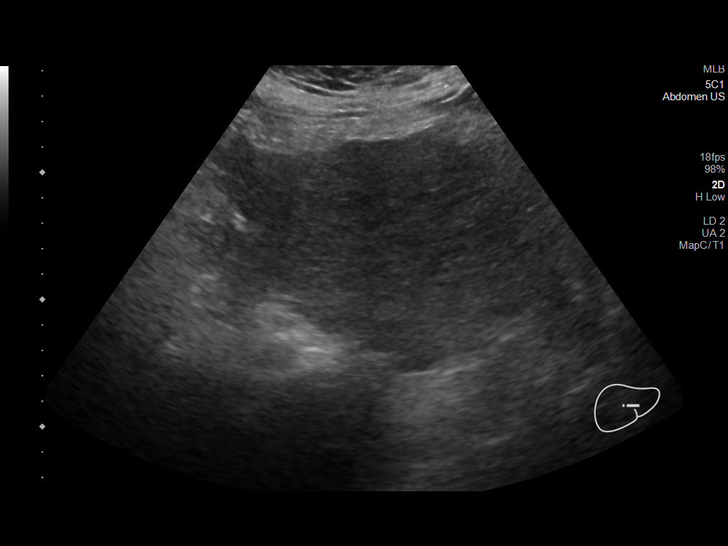
[im 10/28]
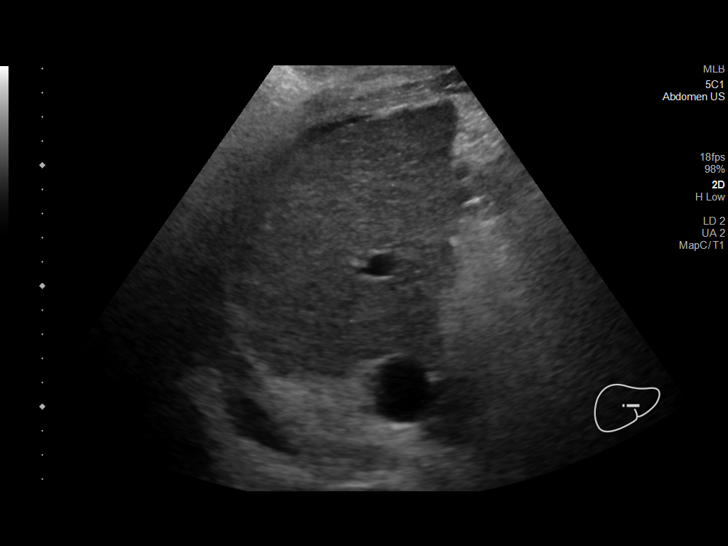
[im 11/28]
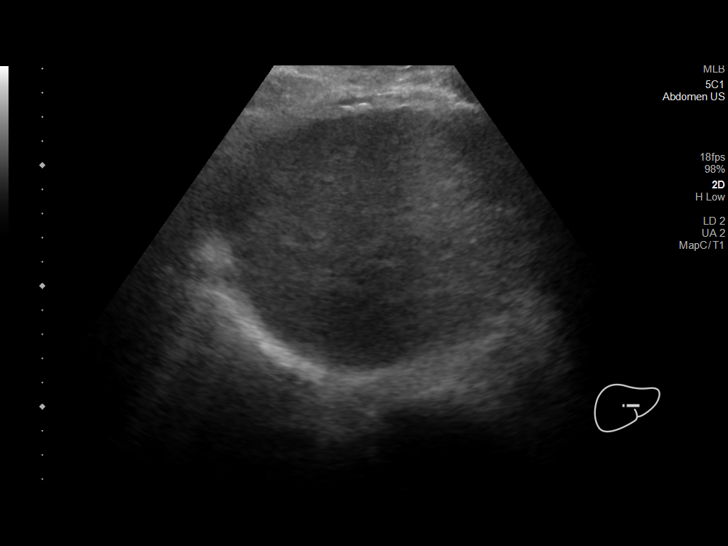
[im 13/28]
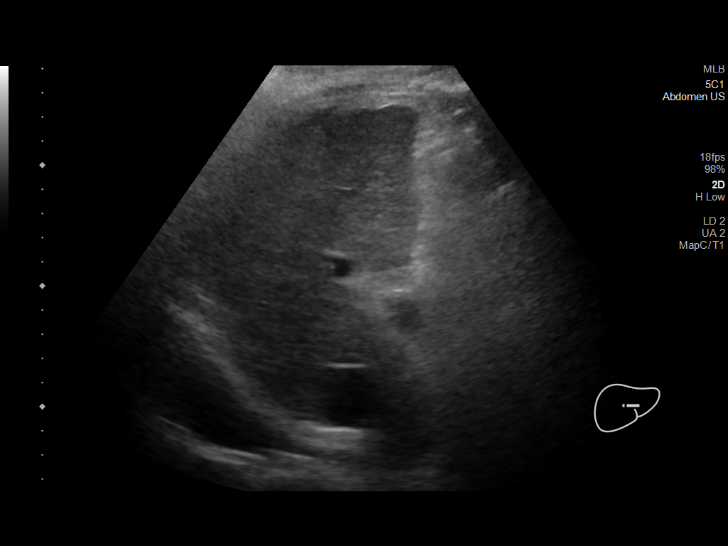
[im 15/28]
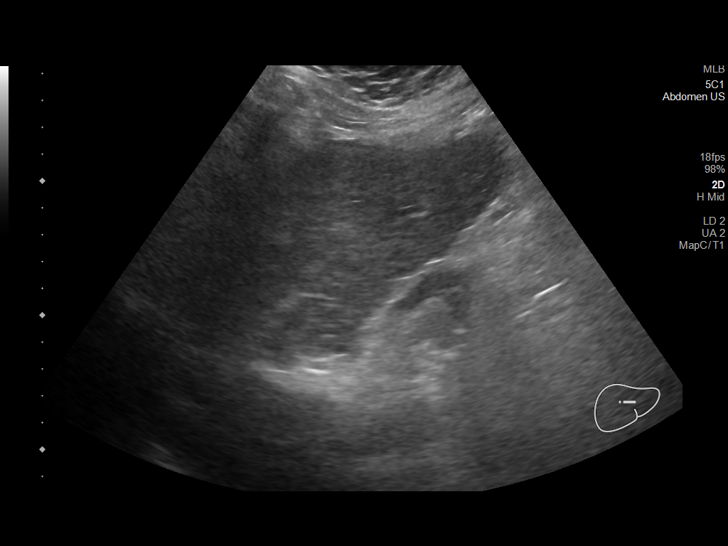
[im 17/28]
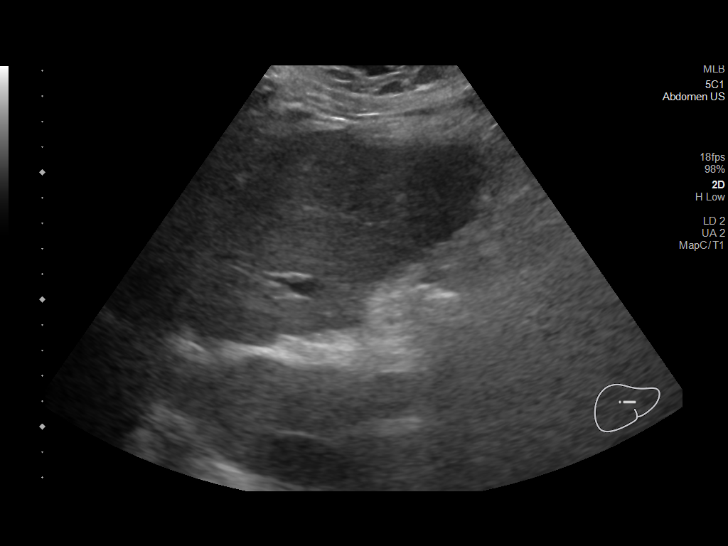
[im 19/28]
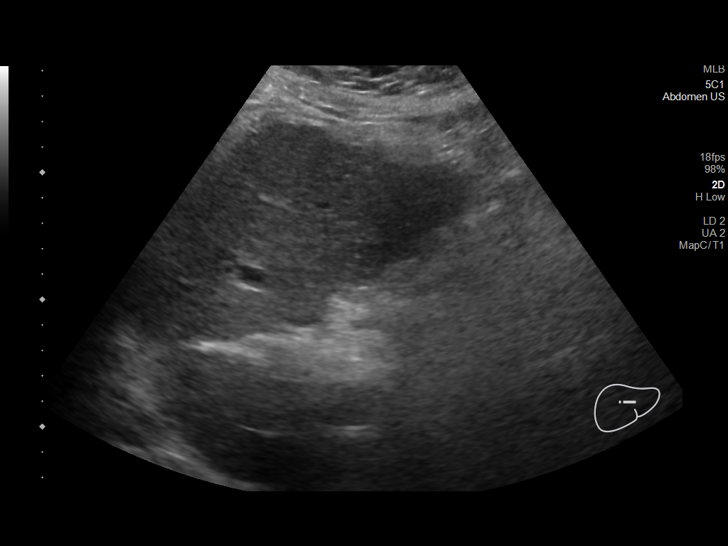
[im 21/28]
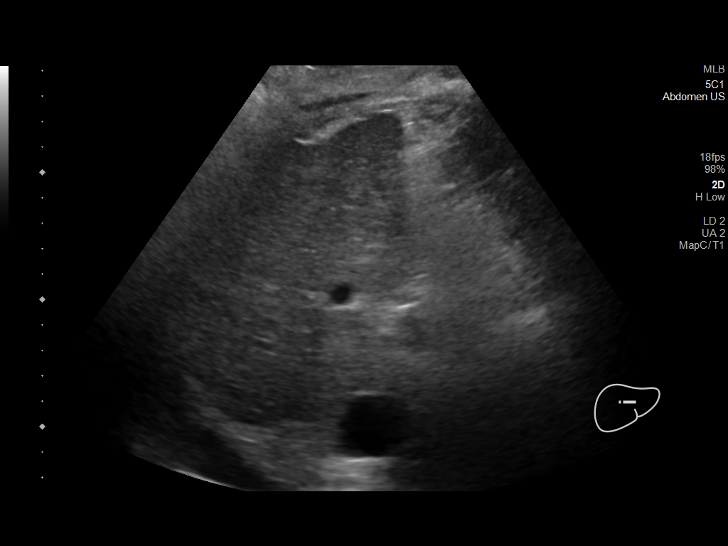
[im 23/28]
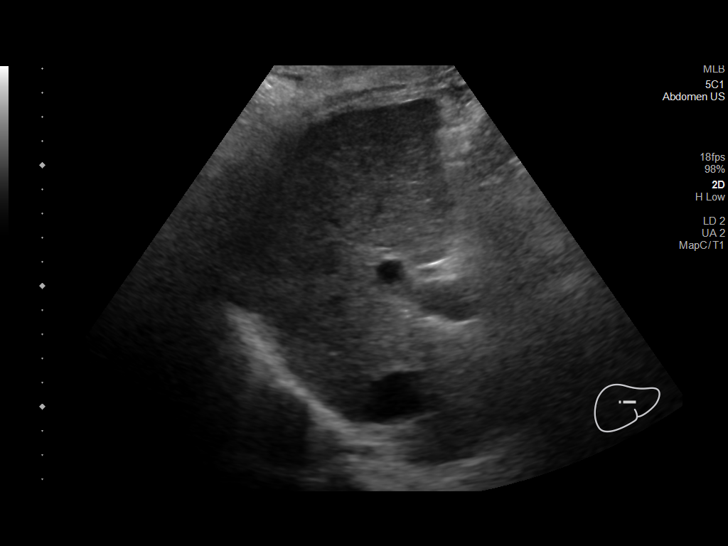
[im 25/28]
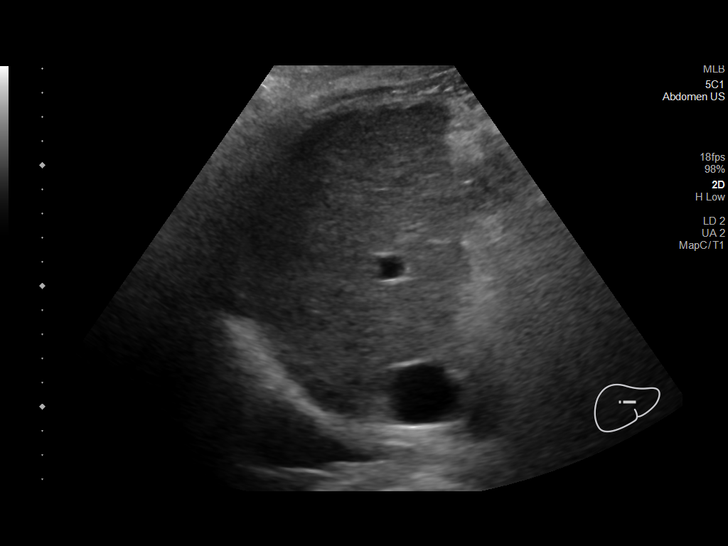
[im 28/28]
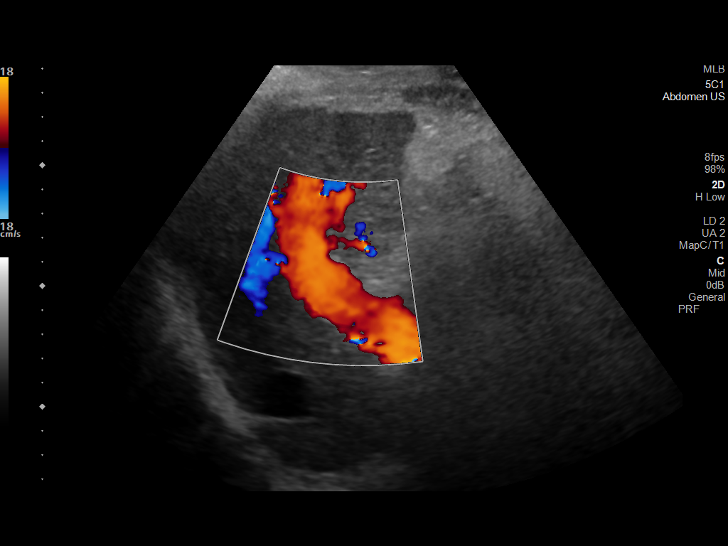

[14 of 25 positions shown; findings below may reference images not displayed]

FINDINGS: Gallbladder:

Surgically absent

Common bile duct:

Diameter: 6 mm, normal post cholecystectomy

Liver:

Coarsened echogenicity of the liver with nodular hepatic margins
consistent with cirrhosis. No discrete hepatic mass identified.
Portal vein is patent on color Doppler imaging with normal direction
of blood flow towards the liver.

Other: Minimal perihepatic ascites.  Small RIGHT pleural effusion.
IMPRESSION: Cirrhotic appearing liver without definite mass.

Post cholecystectomy.

Minimal ascites and small RIGHT pleural effusion.

## 2021-07-10 IMAGING — US US SCROTUM
1 series · 13 of 25 positions shown · non-contrast
Comparison: None.

CLINICAL DATA: Scrotal swelling since this morning

EXAM:
ULTRASOUND OF SCROTUM
TECHNIQUE: Complete ultrasound examination of the testicles, epididymis, and
other scrotal structures was performed.

[Series 1: us scrotum · 13 of 27 slices shown]
[im 1/27]
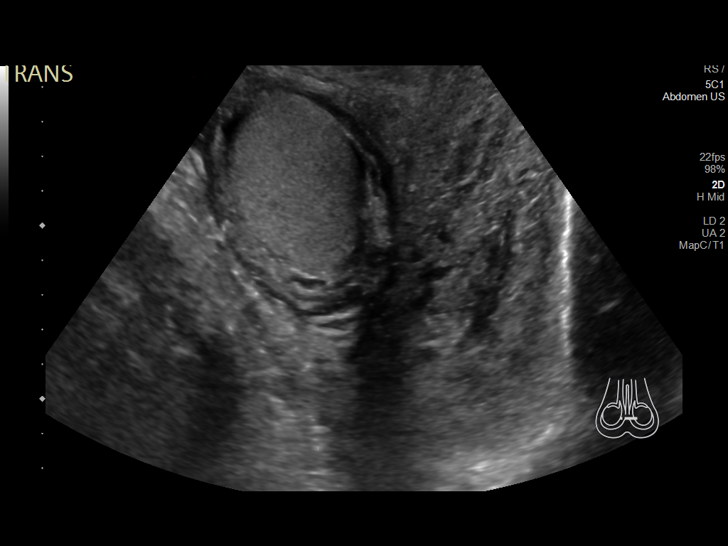
[im 3/27]
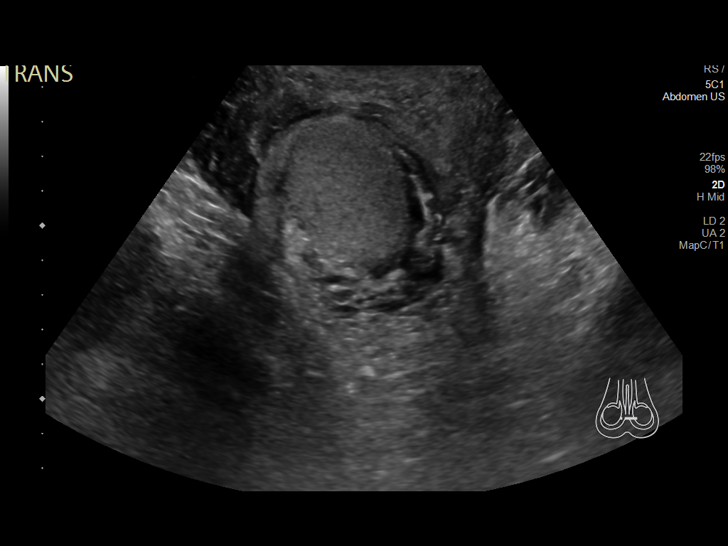
[im 5/27]
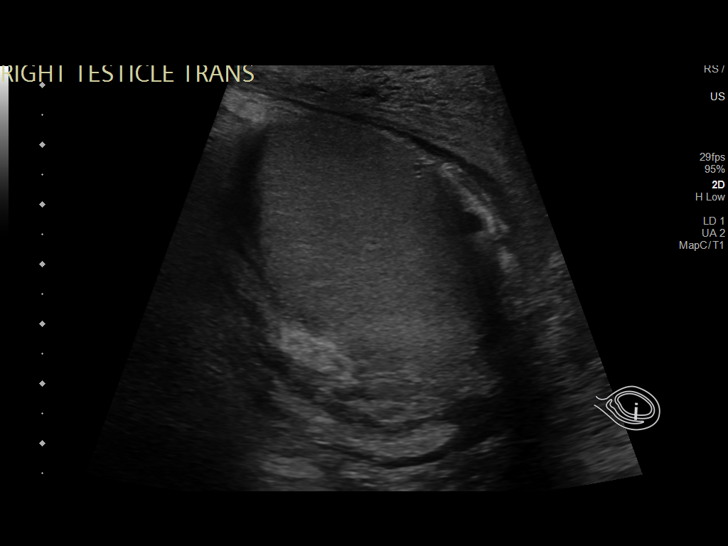
[im 7/27]
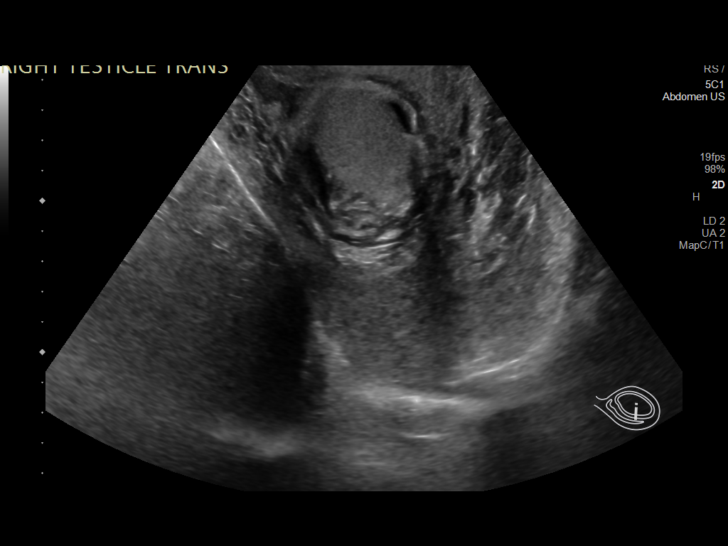
[im 9/27]
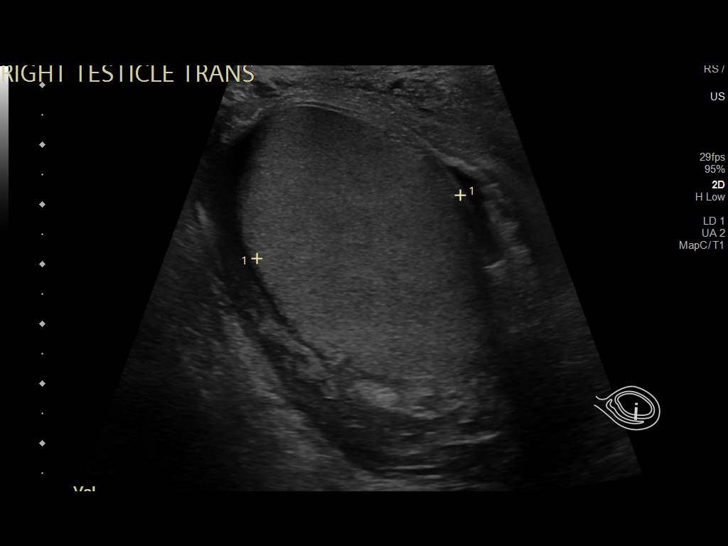
[im 11/27]
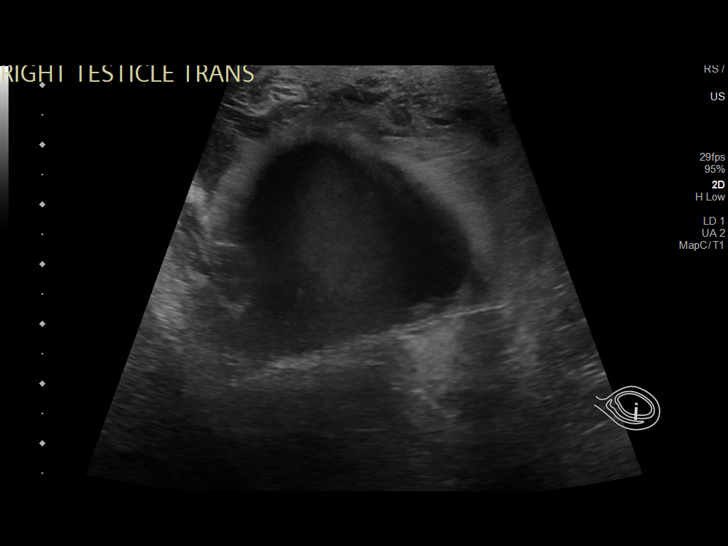
[im 14/27]
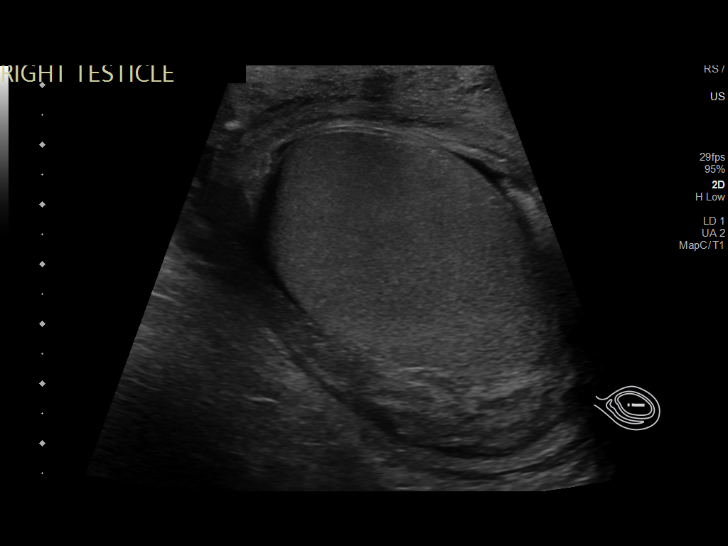
[im 16/27]
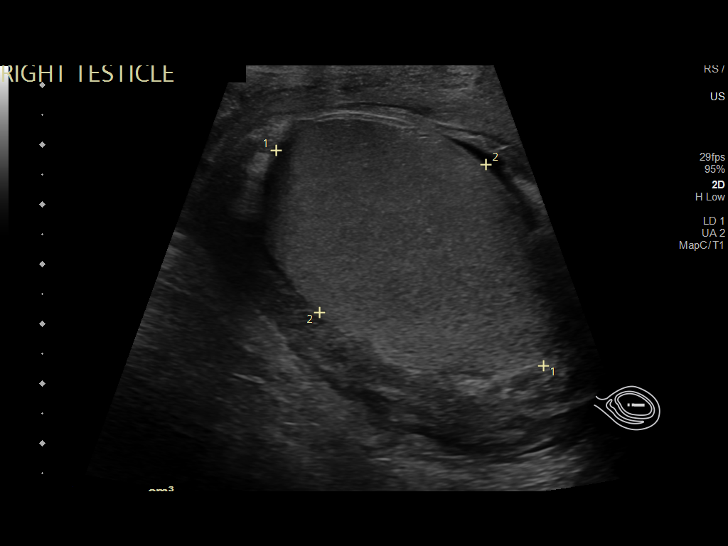
[im 18/27]
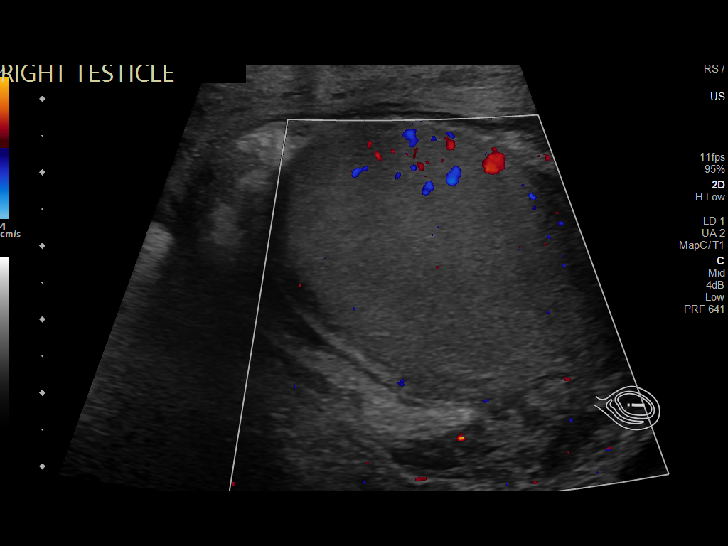
[im 20/27]
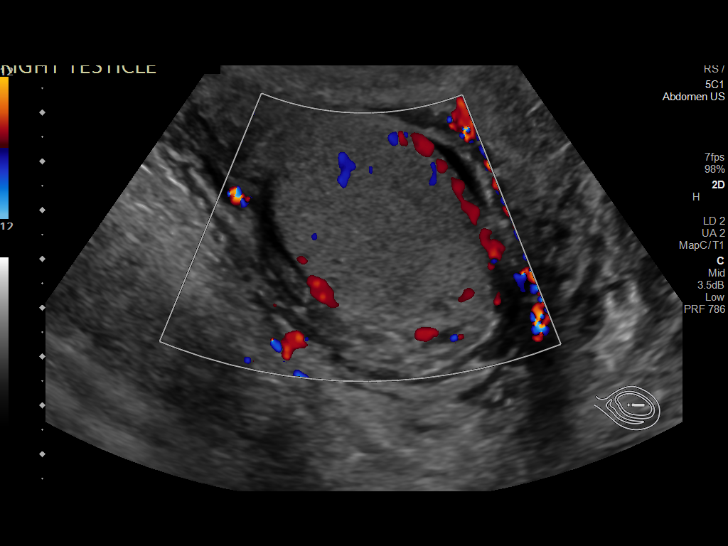
[im 22/27]
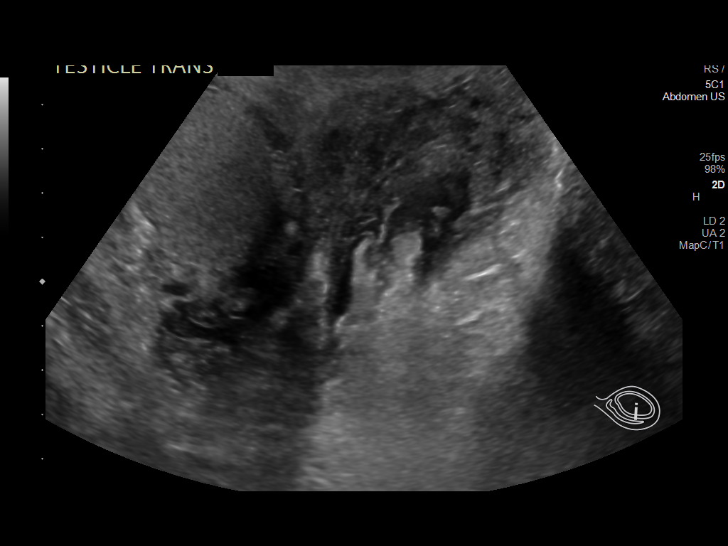
[im 24/27]
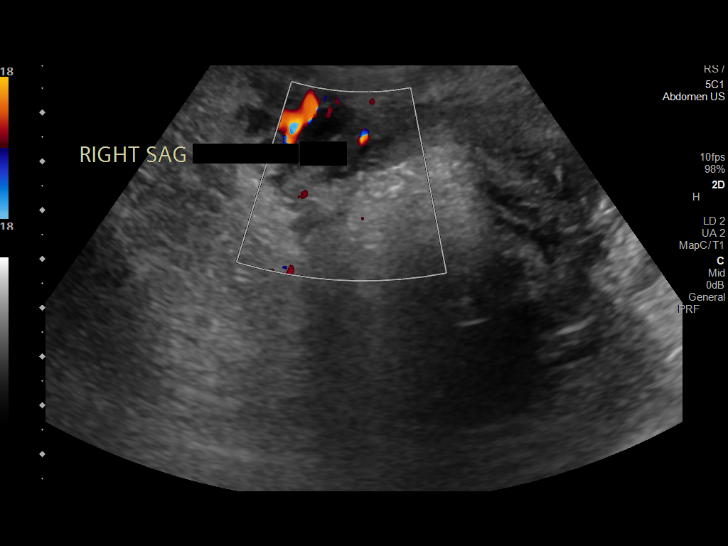
[im 27/27]
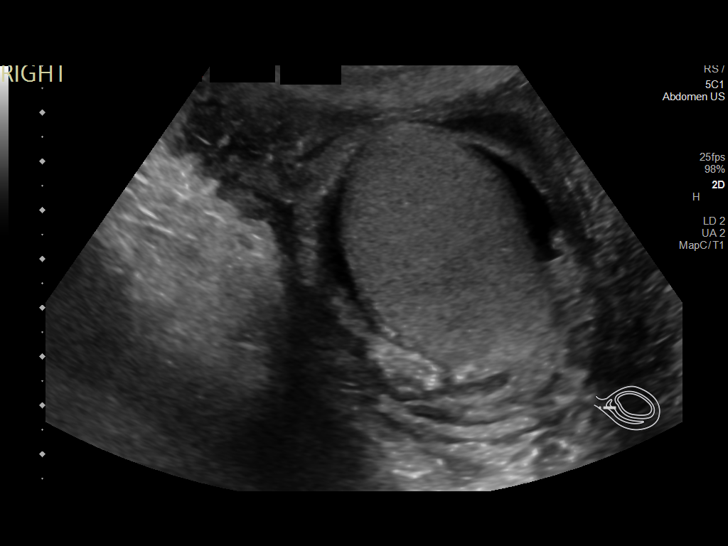

[13 of 25 positions shown; findings below may reference images not displayed]

FINDINGS: Right testicle

Measurements: 5.8 x 3.6 x 3.6 cm. Uniform parenchymal echogenicity.
No mass or microlithiasis.

Left testicle

Not visualized.

Right epididymis: Suboptimal visualization of the right epididymis.
Question some mild heterogeneity seen on sagittal imaging of the
right testicle (16/28).

Left epididymis:  Not visualized

Hydrocele: Slight increase in the amount of normal physiologic
paratesticular fluid on the right.

Varicocele:  None visualized.

Other: Diffuse scrotal skin thickening and edematous changes.
IMPRESSION: Absence of the left testicle. Correlate for surgical history or
congenital absence.

Suboptimal visualization of the right epididymis. Questionable
heterogeneity seen on sagittal imaging of the testicle could reflect
an epididymitis. No convincing features of right orchitis.

Trace right hydrocele, likely reactive.

Diffuse scrotal skin thickening and edematous changes. Correlate for
features of cellulitis. No organized abscess or collection.

## 2021-10-02 ENCOUNTER — Ambulatory Visit: Payer: 59 | Admitting: Podiatry

## 2022-08-19 ENCOUNTER — Encounter: Payer: Self-pay | Admitting: *Deleted

## 2024-01-15 ENCOUNTER — Ambulatory Visit: Payer: Self-pay | Admitting: Physician Assistant

## 2024-01-15 ENCOUNTER — Encounter: Payer: Self-pay | Admitting: Physician Assistant

## 2024-01-15 VITALS — BP 135/70 | HR 84 | Ht 70.0 in | Wt 241.0 lb

## 2024-01-15 DIAGNOSIS — I10 Essential (primary) hypertension: Secondary | ICD-10-CM

## 2024-01-15 DIAGNOSIS — Z7984 Long term (current) use of oral hypoglycemic drugs: Secondary | ICD-10-CM | POA: Diagnosis not present

## 2024-01-15 DIAGNOSIS — Z125 Encounter for screening for malignant neoplasm of prostate: Secondary | ICD-10-CM | POA: Diagnosis not present

## 2024-01-15 DIAGNOSIS — E1165 Type 2 diabetes mellitus with hyperglycemia: Secondary | ICD-10-CM

## 2024-01-15 DIAGNOSIS — Z13220 Encounter for screening for lipoid disorders: Secondary | ICD-10-CM

## 2024-01-15 DIAGNOSIS — F411 Generalized anxiety disorder: Secondary | ICD-10-CM

## 2024-01-15 DIAGNOSIS — Z794 Long term (current) use of insulin: Secondary | ICD-10-CM | POA: Diagnosis not present

## 2024-01-15 DIAGNOSIS — J302 Other seasonal allergic rhinitis: Secondary | ICD-10-CM

## 2024-01-15 DIAGNOSIS — F3342 Major depressive disorder, recurrent, in full remission: Secondary | ICD-10-CM

## 2024-01-15 DIAGNOSIS — E782 Mixed hyperlipidemia: Secondary | ICD-10-CM

## 2024-01-15 LAB — POCT GLYCOSYLATED HEMOGLOBIN (HGB A1C): Hemoglobin A1C: 6.8 % — AB (ref 4.0–5.6)

## 2024-01-15 MED ORDER — ACCU-CHEK AVIVA PLUS W/DEVICE KIT: 1.0000 [IU] | PACK | Freq: Once | 0 refills | Status: AC

## 2024-01-15 MED ORDER — FUROSEMIDE 40 MG PO TABS: 40.0000 mg | ORAL_TABLET | Freq: Every day | ORAL | 1 refills | Status: AC

## 2024-01-15 MED ORDER — FLUOXETINE HCL 10 MG PO CAPS: 10.0000 mg | ORAL_CAPSULE | Freq: Every day | ORAL | 1 refills | Status: AC

## 2024-01-15 MED ORDER — GLIPIZIDE 10 MG PO TABS: 10.0000 mg | ORAL_TABLET | Freq: Once | ORAL | 1 refills | Status: AC

## 2024-01-15 MED ORDER — AMLODIPINE BESYLATE 10 MG PO TABS
10.0000 mg | ORAL_TABLET | Freq: Every day | ORAL | 1 refills | Status: AC
Start: 2024-01-15 — End: ?

## 2024-01-15 MED ORDER — TRUEPLUS 5-BEVEL PEN NEEDLES 32G X 4 MM MISC: 11 refills | Status: AC

## 2024-01-15 MED ORDER — ACCU-CHEK SOFTCLIX LANCETS MISC: 12 refills | Status: AC

## 2024-01-15 MED ORDER — LOSARTAN POTASSIUM 100 MG PO TABS: 100.0000 mg | ORAL_TABLET | Freq: Every day | ORAL | 1 refills | Status: AC

## 2024-01-15 MED ORDER — ACCU-CHEK AVIVA PLUS VI STRP: ORAL_STRIP | 12 refills | Status: AC

## 2024-01-15 MED ORDER — INSULIN GLARGINE 100 UNIT/ML SOLOSTAR PEN: 30.0000 [IU] | PEN_INJECTOR | Freq: Two times a day (BID) | SUBCUTANEOUS | 1 refills | Status: AC

## 2024-01-15 MED ORDER — CETIRIZINE HCL 10 MG PO TABS: 10.0000 mg | ORAL_TABLET | Freq: Every day | ORAL | 11 refills | Status: AC

## 2024-01-15 MED ORDER — TRAZODONE HCL 100 MG PO TABS: 100.0000 mg | ORAL_TABLET | Freq: Every day | ORAL | 1 refills | Status: AC

## 2024-01-15 MED ORDER — ATORVASTATIN CALCIUM 20 MG PO TABS: 20.0000 mg | ORAL_TABLET | Freq: Every day | ORAL | 1 refills | Status: AC

## 2024-01-15 MED ORDER — CHLORTHALIDONE 25 MG PO TABS: 25.0000 mg | ORAL_TABLET | Freq: Every day | ORAL | 1 refills | Status: AC

## 2024-01-15 MED ORDER — INSULIN LISPRO (1 UNIT DIAL) 100 UNIT/ML (KWIKPEN): PEN_INJECTOR | SUBCUTANEOUS | 11 refills | Status: DC

## 2024-01-15 NOTE — Progress Notes (Signed)
 New Patient Office Visit  Subjective    Patient ID: Dennis Green, male    DOB: [birth year]  Age: 58 y.o. MRN: [id number]  CC:  Chief Complaint  Patient presents with   Medication Refill    Patient recently released from prison,awaiting health insurance approval. Needs refills for all medications.     Discussed the use of AI scribe software for clinical note transcription with the patient, who gave verbal consent to proceed.  History of Present Illness  Dennis Green is a 58 year old male with hypertension, diabetes, and hyperlipidemia who presents for medication refills and management of chronic conditions.  He has been without medications since Tuesday after his release from jail, where he was receiving them regularly. For hypertension, he was taking losartan  100 mg, chlorthalidone  25 mg, furosemide  40 mg, and amlodipine  10 mg. His diabetes management includes Lantus  insulin  30 units twice daily and glipizide  10 mg once daily, with an A1c of 6.8. He used a Humalog  pen on a sliding scale when blood sugar was around 200 mg/dL but currently lacks a way to check his blood sugar. For hyperlipidemia, he takes atorvastatin  20 mg. He also takes fluoxetine  10 mg daily for depression and anxiety, and cetirizine  for allergies. He uses an albuterol inhaler as needed, particularly after physical exertion.    Results LABS   A1c: 6.8%     Outpatient Encounter Medications as of 01/15/2024  Medication Sig   Accu-Chek Softclix Lancets lancets Use as instructed, tid PRN   Blood Glucose Monitoring Suppl (ACCU-CHEK AVIVA PLUS) w/Device KIT 1 Units by Does not apply route once for 1 dose.   cetirizine  (ZYRTEC  ALLERGY) 10 MG tablet Take 1 tablet (10 mg total) by mouth daily.   glucose blood (ACCU-CHEK AVIVA PLUS) test strip Use as instructed, three times a day as needed   insulin  glargine (LANTUS ) 100 UNIT/ML Solostar Pen Inject 30 Units into the skin 2 (two) times daily.   insulin  lispro  (HUMALOG  KWIKPEN) 100 UNIT/ML KwikPen [facility] a 10% insulin  sliding scale as follows: on a QID schedule, subtract 200 from glucose value, and administer 10% of that number as additional units of regular insulin . Call physician if glucose is over 400. (eg: glucose= 280, subtract 200 giving 80, take 10% of 80 giving 8 Units additional regular insulin ).   Insulin  Pen Needle (TRUEPLUS 5-BEVEL PEN NEEDLES) 32G X 4 MM MISC Use with Lantus    [DISCONTINUED] amLODipine  (NORVASC ) 10 MG tablet Take 10 mg by mouth daily.   [DISCONTINUED] atorvastatin  (LIPITOR) 20 MG tablet Take 20 mg by mouth daily.   [DISCONTINUED] chlorthalidone  (HYGROTON ) 25 MG tablet Take 25 mg by mouth daily.   [DISCONTINUED] FLUoxetine  (PROZAC ) 10 MG capsule Take 10 mg by mouth daily.   [DISCONTINUED] furosemide  (LASIX ) 40 MG tablet Take 40 mg by mouth daily.   [DISCONTINUED] glipiZIDE  (GLUCOTROL ) 5 MG tablet Take by mouth once. (Patient taking differently: Take 10 mg by mouth once.)   [DISCONTINUED] insulin  glargine (LANTUS ) 100 UNIT/ML injection Inject 30 Units into the skin in the morning and at bedtime.   [DISCONTINUED] Insulin  Lispro (HUMALOG  PEN [place]) Inject 18 Units into the skin 2 (two) times daily as needed.   [DISCONTINUED] losartan  (COZAAR ) 100 MG tablet Take 50 mg by mouth daily. (Patient taking differently: Take 100 mg by mouth daily.)   [DISCONTINUED] traZODone  (DESYREL ) 100 MG tablet Take 100 mg by mouth at bedtime.   albuterol (PROVENTIL) (2.5 MG/3ML) 0.083% nebulizer solution Take 2.5 mg by nebulization every  6 (six) hours as needed for wheezing or shortness of breath.   amLODipine  (NORVASC ) 10 MG tablet Take 1 tablet (10 mg total) by mouth daily.   atorvastatin  (LIPITOR) 20 MG tablet Take 1 tablet (20 mg total) by mouth daily.   chlorthalidone  (HYGROTON ) 25 MG tablet Take 1 tablet (25 mg total) by mouth daily.   FLUoxetine  (PROZAC ) 10 MG capsule Take 1 capsule (10 mg total) by mouth daily.   furosemide  (LASIX ) 40 MG  tablet Take 1 tablet (40 mg total) by mouth daily.   glipiZIDE  (GLUCOTROL ) 10 MG tablet Take 1 tablet (10 mg total) by mouth once for 1 dose.   losartan  (COZAAR ) 100 MG tablet Take 1 tablet (100 mg total) by mouth daily.   traZODone  (DESYREL ) 100 MG tablet Take 1 tablet (100 mg total) by mouth at bedtime.   [DISCONTINUED] amLODipine -atorvastatin  (CADUET) 5-10 MG tablet Take 1 tablet by mouth daily.   [DISCONTINUED] diclofenac Sodium (VOLTAREN) 1 % GEL Apply topically 4 (four) times daily.   [DISCONTINUED] doxycycline (VIBRAMYCIN) 100 MG capsule Take 100 mg by mouth 2 (two) times daily.   [DISCONTINUED] mupirocin ointment (BACTROBAN) 2 % 1 application 2 (two) times daily.   No facility-administered encounter medications on file as of 01/15/2024.    Past Medical History:  Diagnosis Date   Diabetes mellitus without complication (HCC)    Hypertension     Past Surgical History:  Procedure Laterality Date   CHOLECYSTECTOMY     VASECTOMY      Family History  Problem Relation Age of Onset   Hypertension Mother    Hypertension Father    Diabetes Father    Cancer Sister     Social History   Socioeconomic History   Marital status: Single    Spouse name: Not on file   Number of children: Not on file   Years of education: Not on file   Highest education level: Not on file  Occupational History   Not on file  Tobacco Use   Smoking status: Never   Smokeless tobacco: Never  Vaping Use   Vaping status: Never Used  Substance and Sexual Activity   Alcohol use: Yes    Comment: 2 drinks a year    Drug use: Never   Sexual activity: Not on file  Other Topics Concern   Not on file  Social History Narrative   Not on file   Social Drivers of Health   Financial Resource Strain: Not on File (08/23/2021)   Received from [employer]    Financial Resource Strain: 0  Food Insecurity: Not on File (01/30/2023)   Received from [facility] Insecurity    Food: 0   Transportation Needs: Not on File (08/23/2021)   Received from [employer] Needs    Transportation: 0  Physical Activity: Not on File (08/23/2021)   Received from [facility]   Physical Activity    Physical Activity: 0  Stress: Not on File (08/23/2021)   Received from [facility]   Stress    Stress: 0  Social Connections: Not on File (01/30/2023)   [facility]   Social Connections    Connectedness: 0  Intimate Partner Violence: Unknown (08/10/2021)   Received from [facility]   HITS    Physically Hurt: Not on file    Insult or Talk Down To: Not on file    Threaten Physical Harm: Not on file    Scream or Curse: Not on  file    Review of Systems  Constitutional: Negative.   HENT: Negative.    Eyes: Negative.   Respiratory:  Negative for shortness of breath.   Cardiovascular:  Negative for chest pain.  Gastrointestinal: Negative.   Genitourinary: Negative.   Musculoskeletal: Negative.   Skin: Negative.   Neurological: Negative.   Endo/Heme/Allergies: Negative.   Psychiatric/Behavioral: Negative.          Objective    BP 135/70 (BP Location: Left Arm, Patient Position: Sitting, Cuff Size: Large)   Pulse 84   Ht 5' 10 (1.778 m)   Wt 241 lb (109.3 kg)   SpO2 99%   BMI 34.58 kg/m   Physical Exam Vitals and nursing note reviewed.    GENERAL: Alert, cooperative, well developed, no acute distress HEENT: Normocephalic, normal oropharynx, moist mucous membranes CHEST: Clear to auscultation bilaterally, no wheezes, rhonchi, or crackles CARDIOVASCULAR: Normal heart rate and rhythm, S1 and S2 normal without murmurs ABDOMEN: Soft, non-tender, non-distended, without organomegaly, normal bowel sounds EXTREMITIES: No cyanosis or edema NEUROLOGICAL: Cranial nerves grossly intact, moves all extremities without gross motor or sensory deficit    Assessment & Plan:   Problem List Items Addressed This Visit       Cardiovascular and Mediastinum   Essential  hypertension - Primary   Relevant Medications   atorvastatin  (LIPITOR) 20 MG tablet   losartan  (COZAAR ) 100 MG tablet   chlorthalidone  (HYGROTON ) 25 MG tablet   furosemide  (LASIX ) 40 MG tablet   amLODipine  (NORVASC ) 10 MG tablet     Endocrine   Type 2 diabetes mellitus with hyperglycemia, without long-term current use of insulin  (HCC)   Relevant Medications   atorvastatin  (LIPITOR) 20 MG tablet   losartan  (COZAAR ) 100 MG tablet   insulin  glargine (LANTUS ) 100 UNIT/ML Solostar Pen   Insulin  Pen Needle (TRUEPLUS 5-BEVEL PEN NEEDLES) 32G X 4 MM MISC   insulin  lispro (HUMALOG  KWIKPEN) 100 UNIT/ML KwikPen   glipiZIDE  (GLUCOTROL ) 10 MG tablet   Blood Glucose Monitoring Suppl (ACCU-CHEK AVIVA PLUS) w/Device KIT   glucose blood (ACCU-CHEK AVIVA PLUS) test strip   Accu-Chek Softclix Lancets lancets   Other Relevant Orders   CBC with Differential/Platelet   Comp. Metabolic Panel (12)   Microalbumin / creatinine urine ratio     Other   Recurrent major depressive disorder, in full remission (HCC)   Relevant Medications   FLUoxetine  (PROZAC ) 10 MG capsule   traZODone  (DESYREL ) 100 MG tablet   GAD (generalized anxiety disorder)   Relevant Medications   FLUoxetine  (PROZAC ) 10 MG capsule   traZODone  (DESYREL ) 100 MG tablet   Mixed hyperlipidemia   Relevant Medications   atorvastatin  (LIPITOR) 20 MG tablet   losartan  (COZAAR ) 100 MG tablet   chlorthalidone  (HYGROTON ) 25 MG tablet   furosemide  (LASIX ) 40 MG tablet   amLODipine  (NORVASC ) 10 MG tablet   Other Relevant Orders   Lipid panel   Other Visit Diagnoses       Seasonal allergies       Relevant Medications   cetirizine  (ZYRTEC  ALLERGY) 10 MG tablet     Screening PSA (prostate specific antigen)       Relevant Orders   PSA     Screening, lipid         1. Essential hypertension (Primary) Continue current regimen.  Patient encouraged to check blood pressure at home, keep a written log and have available for all office visits.   Patient has appointment with primary care provider in October, encouraged to return  to mobile unit as needed. - losartan  (COZAAR ) 100 MG tablet; Take 1 tablet (100 mg total) by mouth daily.  Dispense: 30 tablet; Refill: 1 - chlorthalidone  (HYGROTON ) 25 MG tablet; Take 1 tablet (25 mg total) by mouth daily.  Dispense: 30 tablet; Refill: 1 - furosemide  (LASIX ) 40 MG tablet; Take 1 tablet (40 mg total) by mouth daily.  Dispense: 30 tablet; Refill: 1 - amLODipine  (NORVASC ) 10 MG tablet; Take 1 tablet (10 mg total) by mouth daily.  Dispense: 3 tablet; Refill: 1  2. Type 2 diabetes mellitus with hyperglycemia, without long-term current use of insulin  (HCC) A1c 6.8.  Continue current regimen. , - insulin  glargine (LANTUS ) 100 UNIT/ML Solostar Pen; Inject 30 Units into the skin 2 (two) times daily.  Dispense: 18 mL; Refill: 1 - Insulin  Pen Needle (TRUEPLUS 5-BEVEL PEN NEEDLES) 32G X 4 MM MISC; Use with Lantus   Dispense: 120 each; Refill: 11 - insulin  lispro (HUMALOG  KWIKPEN) 100 UNIT/ML KwikPen; Institute a 10% insulin  sliding scale as follows: on a QID schedule, subtract 200 from glucose value, and administer 10% of that number as additional units of regular insulin . Call physician if glucose is over 400. (eg: glucose= 280, subtract 200 giving 80, take 10% of 80 giving 8 Units additional regular insulin ).  Dispense: 15 mL; Refill: 11 - glipiZIDE  (GLUCOTROL ) 10 MG tablet; Take 1 tablet (10 mg total) by mouth once for 1 dose.  Dispense: 30 tablet; Refill: 1 - Blood Glucose Monitoring Suppl (ACCU-CHEK AVIVA PLUS) w/Device KIT; 1 Units by Does not apply route once for 1 dose.  Dispense: 1 kit; Refill: 0 - glucose blood (ACCU-CHEK AVIVA PLUS) test strip; Use as instructed, three times a day as needed  Dispense: 100 each; Refill: 12 - Accu-Chek Softclix Lancets lancets; Use as instructed, tid PRN  Dispense: 100 each; Refill: 12 - CBC with Differential/Platelet - Comp. Metabolic Panel (12) - Microalbumin /  creatinine urine ratio  3. Mixed hyperlipidemia Continue current regimen - atorvastatin  (LIPITOR) 20 MG tablet; Take 1 tablet (20 mg total) by mouth daily.  Dispense: 30 tablet; Refill: 1 - Lipid panel  4. Seasonal allergies Continue current regimen - cetirizine  (ZYRTEC  ALLERGY) 10 MG tablet; Take 1 tablet (10 mg total) by mouth daily.  Dispense: 30 tablet; Refill: 11  5. Recurrent major depressive disorder, in full remission (HCC) Continue current regimen - FLUoxetine  (PROZAC ) 10 MG capsule; Take 1 capsule (10 mg total) by mouth daily.  Dispense: 30 capsule; Refill: 1 - traZODone  (DESYREL ) 100 MG tablet; Take 1 tablet (100 mg total) by mouth at bedtime.  Dispense: 30 tablet; Refill: 1  6. GAD (generalized anxiety disorder)  - FLUoxetine  (PROZAC ) 10 MG capsule; Take 1 capsule (10 mg total) by mouth daily.  Dispense: 30 capsule; Refill: 1 - traZODone  (DESYREL ) 100 MG tablet; Take 1 tablet (100 mg total) by mouth at bedtime.  Dispense: 30 tablet; Refill: 1  7. Screening PSA (prostate specific antigen)  - PSA  8. Screening, lipid Fasting labs completed today   I have reviewed the patient's medical history (PMH, PSH, Social History, Family History, Medications, and allergies) , and have been updated if relevant. I spent 50 minutes reviewing chart and  face to face time with patient.      Return if symptoms worsen or fail to improve.   Kirk RAMAN Mayers, PA-C

## 2024-01-15 NOTE — Addendum Note (Signed)
 Addended by: AISHA NORRIS L on: 01/15/2024 05:25 PM   Modules accepted: Orders

## 2024-01-15 NOTE — Patient Instructions (Addendum)
 VISIT SUMMARY:  Today, we reviewed your chronic conditions and refilled your medications. We also discussed your recent release from jail and the need to reinstate your Medicare and Medicaid. Your diabetes, hypertension, and hyperlipidemia are well-controlled with your current medications. We also addressed your anxiety, depression, asthma, and allergies.  YOUR PLAN:  -TYPE 2 DIABETES MELLITUS: Type 2 diabetes is a condition where your body does not use insulin  properly, leading to high blood sugar levels. Your diabetes is well-controlled with an A1c of 6.8%. We have prescribed Lantus  30 units twice daily in pen form and a Humalog  pen for sliding scale use as needed. You will also receive a blood glucose monitoring machine with test strips and lancets.  A metabolic panel has been ordered to monitor your condition.  -ESSENTIAL HYPERTENSION: Hypertension, or high blood pressure, is a condition where the force of the blood against your artery walls is too high. Your hypertension is well-controlled with your current medications. We have prescribed losartan  100 mg daily, chlorthalidone  25 mg daily, furosemide  40 mg daily, and amlodipine  10 mg daily. Please check your blood pressure at the pharmacy and return if the readings are too low.  -CHRONIC KIDNEY DISEASE AND LIVER DYSFUNCTION: Chronic kidney disease and liver dysfunction involve the gradual loss of kidney and liver function over time. We are monitoring your kidney and liver function due to the effects of your medications. A metabolic panel and a urine test for kidney function have been ordered.  -MIXED HYPERLIPIDEMIA: Mixed hyperlipidemia is a condition where you have high levels of different types of fats in your blood. It is managed with atorvastatin . We have prescribed atorvastatin  20 mg daily.  -GENERALIZED ANXIETY DISORDER AND DEPRESSION: Generalized anxiety disorder and depression are mental health conditions that affect your mood and overall  well-being. Your mood is stabilized with fluoxetine . We have prescribed fluoxetine  10 mg daily.  -ASTHMA: Asthma is a condition where your airways narrow and swell, making it difficult to breathe. It is managed with an albuterol inhaler as needed.  -SEASONAL ALLERGIC RHINITIS: Seasonal allergic rhinitis is an allergic reaction to pollen that causes sneezing, congestion, and a runny nose. It is managed with cetirizine . We have prescribed cetirizine  (Zyrtec ).  -GENERAL HEALTH MAINTENANCE: Routine lab work and screenings are ongoing to monitor your overall health. We have ordered a cholesterol screening and a prostate cancer screening.  How to Take Your Blood Pressure Blood pressure is a measurement of how strongly your blood is pressing against the walls of your arteries. Arteries are blood vessels that carry blood from your heart throughout your body. Your health care provider takes your blood pressure at each office visit. You can also take your own blood pressure at home with a blood pressure monitor. You may need to take your own blood pressure to: Confirm a diagnosis of high blood pressure (hypertension). Monitor your blood pressure over time. Make sure your blood pressure medicine is working. Supplies needed: Blood pressure monitor. A chair to sit in. This should be a chair where you can sit upright with your back supported. Do not sit on a soft couch or an armchair. Table or desk. Small notebook and pencil or pen. How to prepare To get the most accurate reading, avoid the following for 30 minutes before you check your blood pressure: Drinking caffeine. Drinking alcohol. Eating. Smoking. Exercising. Five minutes before you check your blood pressure: Use the bathroom and urinate so that you have an empty bladder. Sit quietly in a chair. Do  not talk. How to take your blood pressure To check your blood pressure, follow the instructions in the manual that came with your blood pressure  monitor. If you have a digital blood pressure monitor, the instructions may be as follows: Sit up straight in a chair. Place your feet on the floor. Do not cross your ankles or legs. Rest your left arm at the level of your heart on a table or desk or on the arm of a chair. Pull up your shirt sleeve. Wrap the blood pressure cuff around the upper part of your left arm, 1 inch (2.5 cm) above your elbow. It is best to wrap the cuff around bare skin. Fit the cuff snugly, but not too tightly, around your arm. You should be able to place only one finger between the cuff and your arm. Position the cord so that it rests in the bend of your elbow. Press the power button. Sit quietly while the cuff inflates and deflates. Read the digital reading on the monitor screen and write the numbers down (record them) in a notebook. Wait 2-3 minutes, then repeat the steps, starting at step 1. What does my blood pressure reading mean? A blood pressure reading consists of a higher number over a lower number. Ideally, your blood pressure should be below 120/80. The first (top) number is called the systolic pressure. It is a measure of the pressure in your arteries as your heart beats. The second (bottom) number is called the diastolic pressure. It is a measure of the pressure in your arteries as the heart relaxes. Blood pressure is classified into four stages. The following are the stages for adults who do not have a short-term serious illness or a chronic condition. Systolic pressure and diastolic pressure are measured in a unit called mm Hg (millimeters of mercury).  Normal Systolic pressure: below 120. Diastolic pressure: below 80. Elevated Systolic pressure: 120-129. Diastolic pressure: below 80. Hypertension stage 1 Systolic pressure: 130-139. Diastolic pressure: 80-89. Hypertension stage 2 Systolic pressure: 140 or above. Diastolic pressure: 90 or above. You can have elevated blood pressure or  hypertension even if only the systolic or only the diastolic number in your reading is higher than normal. Follow these instructions at home: Medicines Take over-the-counter and prescription medicines only as told by your health care provider. Tell your health care provider if you are having any side effects from blood pressure medicine. General instructions Check your blood pressure as often as recommended by your health care provider. Check your blood pressure at the same time every day. Take your monitor to the next appointment with your health care provider to make sure that: You are using it correctly. It provides accurate readings. Understand what your goal blood pressure numbers are. Keep all follow-up visits. This is important. General tips Your health care provider can suggest a reliable monitor that will meet your needs. There are several types of home blood pressure monitors. Choose a monitor that has an arm cuff. Do not choose a monitor that measures your blood pressure from your wrist or finger. Choose a cuff that wraps snugly, not too tight or too loose, around your upper arm. You should be able to fit only one finger between your arm and the cuff. You can buy a blood pressure monitor at most drugstores or online. Where to find more information American Heart Association: www.heart.org Contact a health care provider if: Your blood pressure is consistently high. Your blood pressure is suddenly low. Get help right  away if: Your systolic blood pressure is higher than 180. Your diastolic blood pressure is higher than 120. These symptoms may be an emergency. Get help right away. Call 911. Do not wait to see if the symptoms will go away. Do not drive yourself to the hospital. Summary Blood pressure is a measurement of how strongly your blood is pressing against the walls of your arteries. A blood pressure reading consists of a higher number over a lower number. Ideally, your  blood pressure should be below 120/80. Check your blood pressure at the same time every day. Avoid caffeine, alcohol, smoking, and exercise for 30 minutes prior to checking your blood pressure. These agents can affect the accuracy of the blood pressure reading. This information is not intended to replace advice given to you by your health care provider. Make sure you discuss any questions you have with your health care provider. Document Revised: 01/04/2021 Document Reviewed: 01/04/2021 Elsevier Patient Education  2024 [employer].

## 2024-01-16 ENCOUNTER — Telehealth: Payer: Self-pay | Admitting: Physician Assistant

## 2024-01-16 DIAGNOSIS — E1165 Type 2 diabetes mellitus with hyperglycemia: Secondary | ICD-10-CM

## 2024-01-16 LAB — PSA: Prostate Specific Ag, Serum: 1.3 ng/mL (ref 0.0–4.0)

## 2024-01-16 NOTE — Telephone Encounter (Signed)
 Copied from CRM #[id number]. Topic: Clinical - Prescription Issue >> Jan 16, 2024 12:34 PM Fonda T wrote: Reason for CRM: Randall calling from [place] in [place].  Requesting a new prescription for medication to be re-written for the maximum daily dose of medication. Cari Mayers, PA-C at mobile clinic.   Medication: insulin  lispro (HUMALOG  KWIKPEN) 100 UNIT/ML KwikPen  Pharmacy can be reached at [phone]. When calling back ask to speak with Elenor or Eligha.  [id number] - [place], [place] - [address] [place] [address] Phone: [phone] Fax: [phone]

## 2024-01-17 LAB — CBC WITH DIFFERENTIAL/PLATELET
Basophils Absolute: 0 x10E3/uL (ref 0.0–0.2)
Basos: 1 %
EOS (ABSOLUTE): 0.2 x10E3/uL (ref 0.0–0.4)
Eos: 4 %
Hematocrit: 44.2 % (ref 37.5–51.0)
Hemoglobin: 14.8 g/dL (ref 13.0–17.7)
Immature Grans (Abs): 0 x10E3/uL (ref 0.0–0.1)
Immature Granulocytes: 0 %
Lymphocytes Absolute: 0.9 x10E3/uL (ref 0.7–3.1)
Lymphs: 21 %
MCH: 30.8 pg (ref 26.6–33.0)
MCHC: 33.5 g/dL (ref 31.5–35.7)
MCV: 92 fL (ref 79–97)
Monocytes Absolute: 0.3 x10E3/uL (ref 0.1–0.9)
Monocytes: 8 %
Neutrophils Absolute: 3 x10E3/uL (ref 1.4–7.0)
Neutrophils: 66 %
Platelets: 74 x10E3/uL — CL (ref 150–450)
RBC: 4.8 x10E6/uL (ref 4.14–5.80)
RDW: 12.7 % (ref 11.6–15.4)
WBC: 4.5 x10E3/uL (ref 3.4–10.8)

## 2024-01-17 LAB — COMP. METABOLIC PANEL (12)
AST: 38 IU/L (ref 0–40)
Albumin: 4.4 g/dL (ref 3.8–4.9)
Alkaline Phosphatase: 126 IU/L — ABNORMAL HIGH (ref 44–121)
BUN/Creatinine Ratio: 21 (ref 10–24)
BUN: 36 mg/dL — ABNORMAL HIGH (ref 8–27)
Bilirubin Total: 2 mg/dL — ABNORMAL HIGH (ref 0.0–1.2)
Calcium: 9.5 mg/dL (ref 8.6–10.2)
Chloride: 102 mmol/L (ref 96–106)
Creatinine, Ser: 1.73 mg/dL — ABNORMAL HIGH (ref 0.76–1.27)
Globulin, Total: 3.4 g/dL (ref 1.5–4.5)
Glucose: 173 mg/dL — ABNORMAL HIGH (ref 70–99)
Potassium: 4 mmol/L (ref 3.5–5.2)
Sodium: 141 mmol/L (ref 134–144)
Total Protein: 7.8 g/dL (ref 6.0–8.5)
eGFR: 45 mL/min/1.73 — ABNORMAL LOW (ref >59)

## 2024-01-17 LAB — LIPID PANEL
Chol/HDL Ratio: 2.5 ratio (ref 0.0–5.0)
Cholesterol, Total: 102 mg/dL (ref 100–199)
HDL: 41 mg/dL (ref >39)
LDL Chol Calc (NIH): 42 mg/dL (ref 0–99)
Triglycerides: 99 mg/dL (ref 0–149)
VLDL Cholesterol Cal: 19 mg/dL (ref 5–40)

## 2024-01-17 LAB — MICROALBUMIN / CREATININE URINE RATIO
Creatinine, Urine: 216.2 mg/dL
Microalb/Creat Ratio: 25 mg/g{creat} (ref 0–29)
Microalbumin, Urine: 53.5 ug/mL

## 2024-01-18 ENCOUNTER — Ambulatory Visit: Payer: Self-pay | Admitting: Physician Assistant

## 2024-01-19 ENCOUNTER — Telehealth: Payer: Self-pay | Admitting: Physician Assistant

## 2024-01-19 NOTE — Telephone Encounter (Signed)
-----   Message from Kirk GORMAN Sage sent at 01/18/2024  9:33 PM EDT ----- Please call patient and let him know that his screening for prostate cancer was negative, his kidney function is below normal limits, his cholesterol is very well controlled with current regimen.  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  Please have him return to the mobile unit next week for follow-up ----- Message ----- From: Aisha Almarie CROME, CMA Sent: 01/15/2024   5:24 PM EDT To: Kirk GORMAN Sage, PA-C

## 2024-01-19 NOTE — Progress Notes (Signed)
 Name and DOB verified. Results reviewed with pt. He stated understanding and will follow up next week at the [facility] location.

## 2024-01-19 NOTE — Telephone Encounter (Signed)
 Contacted pt to schedule him for follow up visit. Pt stated he would rather do a walk up due to him having to set up transportation.

## 2024-01-20 ENCOUNTER — Ambulatory Visit: Payer: Self-pay

## 2024-01-20 NOTE — Telephone Encounter (Signed)
 FYI Only or Action Required?: FYI only for provider. Pharmasist states they have to have daily dose of units per day, total daily dose.  Unable to reach doctor that ordered this as she works on the mobile unit.  Please clarify daily dose of units.  Patient was last seen in primary care on 01/15/2024 by Mayers, Kirk RAMAN, PA-C.  Called Nurse Triage reporting No chief complaint on file..  Symptoms began today.  Interventions attempted: Nothing.  Symptoms are: na.  Triage Disposition: No disposition on file.  Patient/caregiver understands and will follow disposition?:                 Copied from CRM [id number]. Topic: Clinical - Medication Question >> Jan 20, 2024 11:52 AM Edsel HERO wrote: Elenor with [facility] calling to get clarification on a medication

## 2024-01-21 MED ORDER — INSULIN LISPRO (1 UNIT DIAL) 100 UNIT/ML (KWIKPEN): 0.0000 [IU] | PEN_INJECTOR | Freq: Three times a day (TID) | SUBCUTANEOUS | 1 refills | Status: AC

## 2024-01-21 NOTE — Telephone Encounter (Signed)
 I gave the patient a call to correct the dose of his sliding scale and also sent a prescription to the pharmacy. For blood sugars 0-150 give 0 units of insulin , 151-200 give 2 units of insulin , 201-250 give 4 units, 251-300 give 6 units, 301-350 give 8 units, 351-400 give 10 units,> 400 give 12 units and call M.D.

## 2024-01-21 NOTE — Addendum Note (Signed)
 Addended by: Leanza Shepperson on: 01/21/2024 01:18 PM   Modules accepted: Orders
